# Patient Record
Sex: Female | Born: 1990 | Race: Black or African American | Hispanic: No | Marital: Single | Smoking: Never smoker
Health system: Southern US, Community
[De-identification: ages and names within clinical notes are randomized; demographics above are authoritative.]

## PROBLEM LIST (undated history)

## (undated) ENCOUNTER — Inpatient Hospital Stay (HOSPITAL_COMMUNITY): Payer: Self-pay

## (undated) DIAGNOSIS — D509 Iron deficiency anemia, unspecified: Secondary | ICD-10-CM

## (undated) DIAGNOSIS — N390 Urinary tract infection, site not specified: Secondary | ICD-10-CM

## (undated) DIAGNOSIS — F329 Major depressive disorder, single episode, unspecified: Secondary | ICD-10-CM

## (undated) DIAGNOSIS — O139 Gestational [pregnancy-induced] hypertension without significant proteinuria, unspecified trimester: Secondary | ICD-10-CM

## (undated) DIAGNOSIS — R112 Nausea with vomiting, unspecified: Secondary | ICD-10-CM

## (undated) DIAGNOSIS — B999 Unspecified infectious disease: Secondary | ICD-10-CM

## (undated) HISTORY — PX: GANGLION CYST EXCISION: SHX1691

## (undated) HISTORY — PX: TONSILLECTOMY: SUR1361

---
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
## 2011-01-21 ENCOUNTER — Emergency Department (HOSPITAL_COMMUNITY)
Admission: EM | Admit: 2011-01-21 | Discharge: 2011-01-21 | Disposition: A | Discharge status: Home / Self Care | Payer: Self-pay | Attending: Emergency Medicine | Admitting: Emergency Medicine

## 2011-01-21 DIAGNOSIS — N12 Tubulo-interstitial nephritis, not specified as acute or chronic: Secondary | ICD-10-CM | POA: Insufficient documentation

## 2011-01-21 DIAGNOSIS — R109 Unspecified abdominal pain: Secondary | ICD-10-CM | POA: Insufficient documentation

## 2011-01-21 DIAGNOSIS — R300 Dysuria: Secondary | ICD-10-CM | POA: Insufficient documentation

## 2011-01-21 LAB — URINALYSIS, ROUTINE W REFLEX MICROSCOPIC
Glucose, UA: NEGATIVE mg/dL
Specific Gravity, Urine: 1.01 (ref 1.005–1.030)
pH: 8.5 — ABNORMAL HIGH (ref 5.0–8.0)

## 2011-01-21 LAB — URINE MICROSCOPIC-ADD ON

## 2011-01-23 LAB — URINE CULTURE: Colony Count: >=100000

## 2012-03-09 ENCOUNTER — Encounter (HOSPITAL_COMMUNITY): Payer: Self-pay | Admitting: *Deleted

## 2012-03-09 DIAGNOSIS — B373 Candidiasis of vulva and vagina: Secondary | ICD-10-CM | POA: Insufficient documentation

## 2012-03-09 DIAGNOSIS — B9689 Other specified bacterial agents as the cause of diseases classified elsewhere: Secondary | ICD-10-CM | POA: Insufficient documentation

## 2012-03-09 DIAGNOSIS — A499 Bacterial infection, unspecified: Secondary | ICD-10-CM | POA: Insufficient documentation

## 2012-03-09 DIAGNOSIS — B3731 Acute candidiasis of vulva and vagina: Secondary | ICD-10-CM | POA: Insufficient documentation

## 2012-03-09 DIAGNOSIS — O2390 Unspecified genitourinary tract infection in pregnancy, unspecified trimester: Secondary | ICD-10-CM | POA: Insufficient documentation

## 2012-03-09 DIAGNOSIS — N760 Acute vaginitis: Secondary | ICD-10-CM | POA: Insufficient documentation

## 2012-03-09 NOTE — ED Notes (Signed)
Abdominal pain and back pain x 2 months.  Worried about trichamonis, wants to be checked for STD.  Green discharge for 1 week.  Denies n/v, SOB, CP.  Pt states "think I am 4 months pregnant"

## 2012-03-10 ENCOUNTER — Emergency Department (HOSPITAL_COMMUNITY): Payer: Medicaid Other

## 2012-03-10 ENCOUNTER — Emergency Department (HOSPITAL_COMMUNITY)
Admission: EM | Admit: 2012-03-10 | Discharge: 2012-03-10 | Disposition: A | Discharge status: Home / Self Care | Payer: Medicaid Other | Attending: Emergency Medicine | Admitting: Emergency Medicine

## 2012-03-10 DIAGNOSIS — Z3490 Encounter for supervision of normal pregnancy, unspecified, unspecified trimester: Secondary | ICD-10-CM

## 2012-03-10 DIAGNOSIS — B9689 Other specified bacterial agents as the cause of diseases classified elsewhere: Secondary | ICD-10-CM

## 2012-03-10 DIAGNOSIS — B373 Candidiasis of vulva and vagina: Secondary | ICD-10-CM

## 2012-03-10 HISTORY — DX: Urinary tract infection, site not specified: N39.0

## 2012-03-10 LAB — COMPREHENSIVE METABOLIC PANEL
BUN: 6 mg/dL (ref 6–23)
Calcium: 9 mg/dL (ref 8.4–10.5)
Creatinine, Ser: 0.55 mg/dL (ref 0.50–1.10)
GFR calc Af Amer: >90 mL/min (ref >90)
Glucose, Bld: 92 mg/dL (ref 70–99)
Sodium: 137 mEq/L (ref 135–145)
Total Protein: 6 g/dL (ref 6.0–8.3)

## 2012-03-10 LAB — CBC WITH DIFFERENTIAL/PLATELET
Eosinophils Absolute: 0.1 10*3/uL (ref 0.0–0.7)
Eosinophils Relative: 1 % (ref 0–5)
Lymphs Abs: 2.5 10*3/uL (ref 0.7–4.0)
MCH: 27.9 pg (ref 26.0–34.0)
MCV: 82.3 fL (ref 78.0–100.0)
Monocytes Relative: 11 % (ref 3–12)
Platelets: 275 10*3/uL (ref 150–400)
RBC: 3.55 MIL/uL — ABNORMAL LOW (ref 3.87–5.11)

## 2012-03-10 LAB — URINALYSIS, ROUTINE W REFLEX MICROSCOPIC
Ketones, ur: NEGATIVE mg/dL
Nitrite: NEGATIVE
Specific Gravity, Urine: 1.02 (ref 1.005–1.030)
pH: 8 (ref 5.0–8.0)

## 2012-03-10 LAB — WET PREP, GENITAL

## 2012-03-10 LAB — URINE MICROSCOPIC-ADD ON

## 2012-03-10 MED ORDER — METRONIDAZOLE 500 MG PO TABS: 500.0000 mg | ORAL_TABLET | Freq: Two times a day (BID) | ORAL | Status: AC

## 2012-03-10 MED ORDER — MICONAZOLE NITRATE 100-2 MG-% VA KIT: 1.0000 | PACK | Freq: Two times a day (BID) | VAGINAL | Status: DC

## 2012-03-10 NOTE — ED Provider Notes (Signed)
History     CSN: [id number]  Arrival date & time 03/09/12  2302   First MD Initiated Contact with Patient 03/10/12 [phone]      Chief Complaint  Patient presents with  . Abdominal Pain    (Consider location/radiation/quality/duration/timing/severity/associated sxs/prior treatment) HPI  Pt to the ER with complaints of back pain and abdominal pain for 2 months. She says that she has taken a pregnancy test 3 months ago and it said that she was pregnant. Her last period was between 4-5 months ago. The patient has not sought out any prenatal care. She just "didnt think to do anything". She did notice that her belly was growing because "it is normally flat". She says that her sister shared her vaginal cleansing systems and her sister has trichomoniasis. She denies having N/V/D fevers, weakness, headaches or any other symptoms.  Past Medical History  Diagnosis Date  . UTI (lower urinary tract infection)     Past Surgical History  Procedure Date  . Ganglion cyst excision     History reviewed. No pertinent family history.  History  Substance Use Topics  . Smoking status: Never Smoker   . Smokeless tobacco: Not on file  . Alcohol Use: No    OB History    Grav Para Term Preterm Abortions TAB SAB Ect Mult Living                  Review of Systems  HEENT: denies blurry vision or change in hearing PULMONARY: Denies difficulty breathing and SOB CARDIAC: denies chest pain or heart palpitations MUSCULOSKELETAL:  denies being unable to ambulate ABDOMEN AL: denies severe abdominal pain GU: denies loss of bowel or urinary control NEURO: denies numbness and tingling in extremities SKIN: no new rashes PSYCH: patient denies anxiety or depression. NECK: Pt denies having neck pain    Allergies  Review of patient's allergies indicates no known allergies.  Home Medications  No current outpatient prescriptions on file.  BP 94/60  Pulse 81  Temp 98.4 F (36.9 C) (Oral)  Resp 18   SpO2 100%  LMP 11/27/2011  Physical Exam  Nursing note and vitals reviewed. Constitutional: She appears well-developed and well-nourished. No distress.  HENT:  Head: Normocephalic and atraumatic.  Eyes: Pupils are equal, round, and reactive to light.  Neck: Normal range of motion. Neck supple.  Cardiovascular: Normal rate and regular rhythm.   Pulmonary/Chest: Effort normal.  Abdominal: Soft. There is no hepatosplenomegaly. There is no rigidity, no rebound, no guarding and no CVA tenderness.  Genitourinary: Uterus is enlarged (gravid). Cervix exhibits discharge. Cervix exhibits no motion tenderness. There is tenderness around the vagina. No erythema or bleeding around the vagina. No foreign body around the vagina. There are signs of injury around the vagina. Vaginal discharge found.  Neurological: She is alert.  Skin: Skin is warm and dry.    ED Course  Procedures (including critical care time)  Labs Reviewed  URINALYSIS, ROUTINE W REFLEX MICROSCOPIC - Abnormal; Notable for the following:    APPearance TURBID (*)     Leukocytes, UA SMALL (*)     All other components within normal limits  POCT PREGNANCY, URINE - Abnormal; Notable for the following:    Preg Test, Ur POSITIVE (*)     All other components within normal limits  URINE MICROSCOPIC-ADD ON - Abnormal; Notable for the following:    Bacteria, UA MANY (*)     All other components within normal limits  HCG, QUANTITATIVE, PREGNANCY - Abnormal;  Notable for the following:    hCG, Beta Chain, Quant, S [id number] (*)     All other components within normal limits  CBC WITH DIFFERENTIAL - Abnormal; Notable for the following:    RBC 3.55 (*)     Hemoglobin 9.9 (*)     HCT 29.2 (*)     All other components within normal limits  COMPREHENSIVE METABOLIC PANEL - Abnormal; Notable for the following:    Albumin 2.5 (*)     Total Bilirubin 0.1 (*)     All other components within normal limits  GC/CHLAMYDIA PROBE AMP, URINE  WET PREP,  GENITAL  GC/CHLAMYDIA PROBE AMP, GENITAL   US Ob Limited  03/10/2012  *RADIOLOGY REPORT*  Clinical Data: Pregnant, abdominal cramping.  LIMITED OBSTETRIC ULTRASOUND  Number of Fetuses: 1 Heart Rate: 137 bpm Movement: Identified Presentation: Breech Placental Location: Anterior Previa: Not identified Amniotic Fluid (Subjective): Within normal limits  Vertical pocket:  02/28cm  BPD: 4.0cm   18w   1d   EDC: 08/10/2012  MATERNAL FINDINGS: Uterus/Adnexae: Ovaries not identified.  IMPRESSION: Single intrauterine gestation with cardiac activity and movement documented. Estimated age of 18 weeks 1 day by BPD.  Recommend followup with non-emergent complete OB 14+ wk US examination for fetal biometric evaluation and anatomic survey if not already performed.   Original Report Authenticated By: Waneta Martins, M.D.      1. Pregnant       MDM  Labs show that patient is anemic, she states that she is aware that her blood counts are low. She is pregnant about 18 weeks and ultrasound confirms IUP with fetal heart movement. Will start patient on prenatals and give her referral for follow-up appointment.     Pt cultures pending. Pt handed off to oncoming PA.      Dorthula Matas, PA 03/10/12 [phone]

## 2012-03-10 NOTE — ED Provider Notes (Signed)
21 y/o pregnant [facility] passed from [employer], [place]. Wet prep positive for yeast and clue cells. Diflucan not recommended in pregnancy. Treatment with monistat vaginally. BV treatment with flagyl. Prenatal care discussed by Marlon Pel. Follow up with [facility].  Trevor Mace, [place] 03/10/12 [phone]

## 2012-03-10 NOTE — ED Notes (Signed)
Pt c/o lower diffuse back pain that radiates to lateral abd and then around to lower abd  x 2 months. Pt reports a few months ago she had an increase in vaginal discharge with a yellow tint. Last week the pt noticed her discharge has changed green. Pt reports her sister was d/x with trichomoniasis, pt realized she had been sharing the same feminine hygiene products and bath towels with her sisters.

## 2012-03-14 NOTE — ED Provider Notes (Signed)
Medical screening examination/treatment/procedure(s) were conducted as a shared visit with non-physician practitioner(s) and myself.  I personally evaluated the patient during the encounter.  No acute abdomen.   Ultrasound shows 18-1/7 week intrauterine gestation  Donnetta Hutching, MD 03/14/12 [phone]

## 2012-03-15 NOTE — ED Notes (Signed)
+  Chlamydia Chart sent to EDP office for review.  

## 2012-03-15 NOTE — ED Provider Notes (Signed)
Medical screening examination/treatment/procedure(s) were performed by non-physician practitioner and as supervising physician I was immediately available for consultation/collaboration.  Donnetta Hutching, MD 03/15/12 1530

## 2012-03-17 NOTE — ED Notes (Signed)
Left message for patient to call back  

## 2012-03-18 NOTE — ED Notes (Signed)
Left voicemail for patient to call back. 

## 2012-03-27 NOTE — ED Notes (Signed)
Current scriber attempted to contact patient to check on status of treatment. Message left with mother for patient to return call.According to [facility] at [facility]  Patient has not been treated or currently scheduled for an appointment with them.

## 2012-03-27 NOTE — ED Notes (Signed)
ON 03/20/2012 Patty PFM talked with  Patient and she stated that," she did not believe the results and wanted to be retested".She was referred to the STD clinic. Prescription not called in.

## 2012-04-01 ENCOUNTER — Telehealth (HOSPITAL_COMMUNITY): Payer: Self-pay | Admitting: Emergency Medicine

## 2012-07-02 ENCOUNTER — Encounter (HOSPITAL_COMMUNITY): Payer: Self-pay | Admitting: *Deleted

## 2012-07-02 ENCOUNTER — Inpatient Hospital Stay (HOSPITAL_COMMUNITY)
Admission: AD | Admit: 2012-07-02 | Discharge: 2012-07-07 | DRG: 765 | Disposition: A | Discharge status: Home / Self Care | Payer: Medicaid Other | Source: Ambulatory Visit | Attending: Obstetrics | Admitting: Obstetrics

## 2012-07-02 DIAGNOSIS — O321XX0 Maternal care for breech presentation, not applicable or unspecified: Secondary | ICD-10-CM | POA: Diagnosis present

## 2012-07-02 DIAGNOSIS — O365990 Maternal care for other known or suspected poor fetal growth, unspecified trimester, not applicable or unspecified: Secondary | ICD-10-CM | POA: Diagnosis present

## 2012-07-02 DIAGNOSIS — O0930 Supervision of pregnancy with insufficient antenatal care, unspecified trimester: Secondary | ICD-10-CM

## 2012-07-02 DIAGNOSIS — O9903 Anemia complicating the puerperium: Secondary | ICD-10-CM | POA: Diagnosis not present

## 2012-07-02 DIAGNOSIS — O169 Unspecified maternal hypertension, unspecified trimester: Secondary | ICD-10-CM | POA: Diagnosis present

## 2012-07-02 DIAGNOSIS — D649 Anemia, unspecified: Secondary | ICD-10-CM | POA: Diagnosis not present

## 2012-07-02 DIAGNOSIS — O139 Gestational [pregnancy-induced] hypertension without significant proteinuria, unspecified trimester: Principal | ICD-10-CM | POA: Diagnosis present

## 2012-07-02 DIAGNOSIS — O4100X0 Oligohydramnios, unspecified trimester, not applicable or unspecified: Secondary | ICD-10-CM | POA: Diagnosis present

## 2012-07-02 HISTORY — DX: Gestational (pregnancy-induced) hypertension without significant proteinuria, unspecified trimester: O13.9

## 2012-07-02 HISTORY — DX: Unspecified infectious disease: B99.9

## 2012-07-02 LAB — CBC
MCH: 20.4 pg — ABNORMAL LOW (ref 26.0–34.0)
MCHC: 30.2 g/dL (ref 30.0–36.0)
MCV: 67.5 fL — ABNORMAL LOW (ref 78.0–100.0)
Platelets: 243 10*3/uL (ref 150–400)
RDW: 17.8 % — ABNORMAL HIGH (ref 11.5–15.5)
WBC: 9.8 10*3/uL (ref 4.0–10.5)

## 2012-07-02 LAB — DIFFERENTIAL
Basophils Absolute: 0 10*3/uL (ref 0.0–0.1)
Basophils Relative: 0 % (ref 0–1)
Eosinophils Absolute: 0.1 10*3/uL (ref 0.0–0.7)
Eosinophils Relative: 1 % (ref 0–5)
Lymphocytes Relative: 17 % (ref 12–46)

## 2012-07-02 LAB — OB RESULTS CONSOLE ABO/RH: RH Type: NEGATIVE

## 2012-07-02 LAB — COMPREHENSIVE METABOLIC PANEL
AST: 22 U/L (ref 0–37)
Albumin: 2.1 g/dL — ABNORMAL LOW (ref 3.5–5.2)
Calcium: 8.7 mg/dL (ref 8.4–10.5)
Creatinine, Ser: 0.67 mg/dL (ref 0.50–1.10)
Total Protein: 5.8 g/dL — ABNORMAL LOW (ref 6.0–8.3)

## 2012-07-02 LAB — OB RESULTS CONSOLE HIV ANTIBODY (ROUTINE TESTING): HIV: NONREACTIVE

## 2012-07-02 LAB — HEPATITIS B SURFACE ANTIGEN: Hepatitis B Surface Ag: NEGATIVE

## 2012-07-02 LAB — PROCEDURE REPORT - SCANNED: Pap: NEGATIVE

## 2012-07-02 LAB — PREPARE RBC (CROSSMATCH)

## 2012-07-02 LAB — RAPID HIV SCREEN (WH-MAU): Rapid HIV Screen: NONREACTIVE

## 2012-07-02 MED ORDER — MAGNESIUM SULFATE 40 G IN LACTATED RINGERS - SIMPLE
2.0000 g/h | INTRAVENOUS | Status: DC
  Administered 2012-07-02 – 2012-07-03 (×2): 2 g/h via INTRAVENOUS
  Filled 2012-07-02 (×2): qty 500

## 2012-07-02 MED ORDER — CALCIUM CARBONATE ANTACID 500 MG PO CHEW: 2.0000 | CHEWABLE_TABLET | ORAL | Status: DC | PRN

## 2012-07-02 MED ORDER — ZOLPIDEM TARTRATE 5 MG PO TABS: 5.0000 mg | ORAL_TABLET | Freq: Every evening | ORAL | Status: DC | PRN

## 2012-07-02 MED ORDER — DOCUSATE SODIUM 100 MG PO CAPS
100.0000 mg | ORAL_CAPSULE | Freq: Every day | ORAL | Status: DC
  Administered 2012-07-02 – 2012-07-03 (×2): 100 mg via ORAL
  Filled 2012-07-02 (×2): qty 1

## 2012-07-02 MED ORDER — ACETAMINOPHEN 325 MG PO TABS: 650.0000 mg | ORAL_TABLET | ORAL | Status: DC | PRN

## 2012-07-02 MED ORDER — MAGNESIUM SULFATE BOLUS VIA INFUSION
4.0000 g | Freq: Once | INTRAVENOUS | Status: DC
  Filled 2012-07-02: qty 500

## 2012-07-02 MED ORDER — PRENATAL MULTIVITAMIN CH
1.0000 | ORAL_TABLET | Freq: Every day | ORAL | Status: DC
  Administered 2012-07-02 – 2012-07-03 (×2): 1 via ORAL
  Filled 2012-07-02 (×2): qty 1

## 2012-07-02 MED ORDER — LACTATED RINGERS IV SOLN
INTRAVENOUS | Status: DC
  Administered 2012-07-03 (×2): via INTRAVENOUS

## 2012-07-03 ENCOUNTER — Encounter (HOSPITAL_COMMUNITY): Payer: Medicaid Other

## 2012-07-03 ENCOUNTER — Inpatient Hospital Stay (HOSPITAL_COMMUNITY): Payer: Medicaid Other

## 2012-07-03 ENCOUNTER — Encounter (HOSPITAL_COMMUNITY): Payer: Self-pay | Admitting: Obstetrics

## 2012-07-03 DIAGNOSIS — O169 Unspecified maternal hypertension, unspecified trimester: Secondary | ICD-10-CM | POA: Diagnosis present

## 2012-07-03 DIAGNOSIS — O0930 Supervision of pregnancy with insufficient antenatal care, unspecified trimester: Secondary | ICD-10-CM

## 2012-07-03 LAB — CREATININE CLEARANCE, URINE, 24 HOUR
Collection Interval-CRCL: 24 hours
Creatinine Clearance: 170 mL/min — ABNORMAL HIGH (ref 75–115)
Urine Total Volume-CRCL: 6300 mL

## 2012-07-03 LAB — PROTEIN, URINE, 24 HOUR
Collection Interval-UPROT: 24 hours
Protein, 24H Urine: 2646 mg/d — ABNORMAL HIGH (ref 50–100)
Protein, Urine: 42 mg/dL

## 2012-07-03 MED ORDER — SODIUM CHLORIDE 0.9 % IJ SOLN
3.0000 mL | Freq: Two times a day (BID) | INTRAMUSCULAR | Status: DC
  Administered 2012-07-03: 3 mL via INTRAVENOUS

## 2012-07-03 MED ORDER — RHO D IMMUNE GLOBULIN 1500 UNIT/2ML IJ SOLN
300.0000 ug | Freq: Once | INTRAMUSCULAR | Status: AC
  Administered 2012-07-03: 300 ug via INTRAMUSCULAR
  Filled 2012-07-03: qty 2

## 2012-07-03 NOTE — Progress Notes (Signed)
MATERNAL FETAL MEDICINE CONSULT  Patient Name: Amanda Campos Medical Record Number:  [id number] Date of Birth: 06/30/1991 Requesting Physician Name:  Bernard A Marshall, MD Date of Service: 07/03/2012  Chief Complaint Elevated BPs, no prenatal care  History of Present Illness Amanda Campos was seen today for prenatal diagnosis secondary to elevated blood pressures in the setting of no prenatal care at [redacted]w[redacted]d at the request of Dr. Marshall.  The patient is a 21 y.o. G1P0000,at [redacted]w[redacted]d with an EDD of 08/10/2012, by 18 week ultrasound at [place] [facility].  Amanda Campos reports she does not have any personal history of hypertension.  She reports headaches for the last several weeks in the morning that spontaneously resolve upon eating/drinking in the morning.  She denies any visual changes and denies RUQ pain.  She endorses rare contractions, no loss of fluid, no vaginal bleeding and good gross fetal movement.  She reports being admitted upon finding an elevated blood pressure at her office visit.  She denies any shortness of breath or chest pain.    Review of Systems Pertinent items are noted in HPI.  Patient History OB History    Grav Para Term Preterm Abortions TAB SAB Ect Mult Living   1 0 0 0 0 0 0 0 0 0     # Outc Date GA Lbr Len/2nd Wgt Sex Del Anes PTL Lv   1 CUR               Past Medical History  Diagnosis Date  . UTI (lower urinary tract infection)   . Pregnancy induced hypertension   . Infection 2011    kidney infection, was treated    Past Surgical History  Procedure Date  . Ganglion cyst excision     History   Social History  . Marital Status: Single    Spouse Name: N/A    Number of Children: N/A  . Years of Education: N/A   Social History Main Topics  . Smoking status: Never Smoker   . Smokeless tobacco: Never Used  . Alcohol Use: No  . Drug Use: No  . Sexually Active: Yes   Other Topics Concern  . None   Social History Narrative  . None    History  reviewed. No pertinent family history. In addition, the patient has no family history of mental retardation, birth defects, or genetic diseases.  Physical Examination Filed Vitals:   07/03/12 1100  BP: 143/100  Pulse: 92  Temp: 98.6  Resp: 18   General appearance - alert, well appearing, and in no distress Chest- clear to auscultation bilaterally CV- RRR Abd- soft, gravid, Nontender Ext- 3+ DTRs bilateral lower extremities, no clonus  Assessment and Recommendations 21 year old G1 @ [redacted]w[redacted]d with elevated blood pressures in the setting of no prenatal care - At this time Amanda Campos meets criteria for gestational hypertension without severe features.  She had one blood pressure in the severe range but the remainder of her pressures were mild range.  She does not endorse any symptoms of preeclampsia at this time.  Please see [facility] for full report but ultrasound shows asymmetric fetal growth that is overall in the 19th percentile but has an AC <5th percentile.  Oligohydramnios is present by AFI criteria (3.3) but a 2 x 2 cm pocket is visibile.  - At this point we recommend continuing the evaluation for preeclampsia with expectant management until 37 weeks at which time a delivery in the setting of either gestational hypertension   or preeclampsia would be warranted.  We would like to reevaluate her fetus and oligohydramnios with a BPP on Friday. -Amanda Campos is currently breech and would require a cesarean should delivery be imminent.   -This patient was discussed with Dr. Decker who agrees with the above plan.   Thank you for allowing me to participate in the care of this patient.   Naydeen Speirs, MD Maternal Fetal Medicine Fellow  

## 2012-07-03 NOTE — H&P (Signed)
This is Dr. Francoise Ceo dictating the history and physical on  Amanda Campos she's a 21 year old primigravida EDC  1 24  14  0 at 34 weeks and 4 days   blood type O- who has had no prenatal care she was seen in the office on 1216 with a blood pressure level  155/105   Two  plus edema no headache no epigastric pain patient says she's been going to [facility] whenever she feels like    and she was admitted because of PIH on admission hemoglobin 7.8 platelets 243 glucose of  72 AST 22 ALT 12 and she was started on magnesium sulfate 4 g loading rhythm no her blood pressure no is 151/88 and she is to see MFM today and you and and ultrasound Past medical history negative Past surgical history negative System review negative Social history noncontributory HEENT negative breasts engorged Lungs clear to P&A Heart regular rhythm no murmurs no gallops Abdomen Foley size uterus with a normal fetal heart note Pelvic cervix long closed GBS unknown cultures were done Extremities 2+ edema

## 2012-07-03 NOTE — Consult Note (Signed)
MATERNAL FETAL MEDICINE CONSULT  Patient Name: Amanda Campos Medical Record Number:  [id number] Date of Birth: 03/08/1991 Requesting Physician Name:  Kathreen Cosier, MD Date of Service: 07/03/2012  Chief Complaint Elevated BPs, no prenatal care  History of Present Illness Amanda Campos was seen today for prenatal diagnosis secondary to elevated blood pressures in the setting of no prenatal care at [redacted]w[redacted]d at the request of Dr. Gaynell Face.  The patient is a 21 y.o. G1P0000,at [redacted]w[redacted]d with an EDD of 08/10/2012, by 18 week ultrasound at [facility].  Amanda Campos reports she does not have any personal history of hypertension.  She reports headaches for the last several weeks in the morning that spontaneously resolve upon eating/drinking in the morning.  She denies any visual changes and denies RUQ pain.  She endorses rare contractions, no loss of fluid, no vaginal bleeding and good gross fetal movement.  She reports being admitted upon finding an elevated blood pressure at her office visit.  She denies any shortness of breath or chest pain.    Review of Systems Pertinent items are noted in HPI.  Patient History OB History    Grav Para Term Preterm Abortions TAB SAB Ect Mult Living   1 0 0 0 0 0 0 0 0 0      # Outc Date GA Lbr Len/2nd Wgt Sex Del Anes PTL Lv   1 CUR               Past Medical History  Diagnosis Date  . UTI (lower urinary tract infection)   . Pregnancy induced hypertension   . Infection 2011    kidney infection, was treated    Past Surgical History  Procedure Date  . Ganglion cyst excision     History   Social History  . Marital Status: Single    Spouse Name: N/A    Number of Children: N/A  . Years of Education: N/A   Social History Main Topics  . Smoking status: Never Smoker   . Smokeless tobacco: Never Used  . Alcohol Use: No  . Drug Use: No  . Sexually Active: Yes   Other Topics Concern  . None   Social History Narrative  . None    History  reviewed. No pertinent family history. In addition, the patient has no family history of mental retardation, birth defects, or genetic diseases.  Physical Examination Filed Vitals:   07/03/12 1100  BP: 143/100  Pulse: 92  Temp: 98.6  Resp: 18   General appearance - alert, well appearing, and in no distress Chest- clear to auscultation bilaterally CV- RRR Abd- soft, gravid, Nontender Ext- 3+ DTRs bilateral lower extremities, no clonus  Assessment and Recommendations 21 year old G1 @ [redacted]w[redacted]d with elevated blood pressures in the setting of no prenatal care - At this time Amanda Campos meets criteria for gestational hypertension without severe features.  She had one blood pressure in the severe range but the remainder of her pressures were mild range.  She does not endorse any symptoms of preeclampsia at this time.  Please see [facility] for full report but ultrasound shows asymmetric fetal growth that is overall in the 19th percentile but has an AC <5th percentile.  Oligohydramnios is present by AFI criteria (3.3) but a 2 x 2 cm pocket is visibile.  - At this point we recommend continuing the evaluation for preeclampsia with expectant management until 37 weeks at which time a delivery in the setting of either gestational hypertension  or preeclampsia would be warranted.  We would like to reevaluate her fetus and oligohydramnios with a BPP on Friday. -Amanda Campos is currently breech and would require a cesarean should delivery be imminent.   -This patient was discussed with Dr. Sherrie George who agrees with the above plan.   Thank you for allowing me to participate in the care of this patient.   Molly Maduro, MD Maternal Fetal Medicine Fellow

## 2012-07-04 ENCOUNTER — Inpatient Hospital Stay (HOSPITAL_COMMUNITY): Payer: Medicaid Other | Admitting: Anesthesiology

## 2012-07-04 ENCOUNTER — Encounter (HOSPITAL_COMMUNITY): Payer: Self-pay | Admitting: Anesthesiology

## 2012-07-04 ENCOUNTER — Encounter (HOSPITAL_COMMUNITY)
Admission: AD | Disposition: A | Discharge status: Home / Self Care | Payer: Self-pay | Source: Ambulatory Visit | Attending: Obstetrics

## 2012-07-04 ENCOUNTER — Encounter (HOSPITAL_COMMUNITY): Payer: Self-pay | Admitting: *Deleted

## 2012-07-04 DIAGNOSIS — O09299 Supervision of pregnancy with other poor reproductive or obstetric history, unspecified trimester: Secondary | ICD-10-CM

## 2012-07-04 LAB — RH IG WORKUP (INCLUDES ABO/RH)
Fetal Screen: NEGATIVE
Gestational Age(Wks): 34.4

## 2012-07-04 LAB — CBC
HCT: 26.6 % — ABNORMAL LOW (ref 36.0–46.0)
Hemoglobin: 8.1 g/dL — ABNORMAL LOW (ref 12.0–15.0)
MCH: 20.4 pg — ABNORMAL LOW (ref 26.0–34.0)
MCHC: 30.5 g/dL (ref 30.0–36.0)
RBC: 3.97 MIL/uL (ref 3.87–5.11)

## 2012-07-04 SURGERY — Surgical Case
Anesthesia: Spinal | Site: Abdomen | Wound class: Clean Contaminated

## 2012-07-04 MED ORDER — ZOLPIDEM TARTRATE 5 MG PO TABS: 5.0000 mg | ORAL_TABLET | Freq: Every evening | ORAL | Status: DC | PRN

## 2012-07-04 MED ORDER — OXYCODONE-ACETAMINOPHEN 5-325 MG PO TABS
1.0000 | ORAL_TABLET | ORAL | Status: DC | PRN
  Administered 2012-07-05 – 2012-07-07 (×3): 1 via ORAL
  Filled 2012-07-04 (×3): qty 1

## 2012-07-04 MED ORDER — SENNOSIDES-DOCUSATE SODIUM 8.6-50 MG PO TABS
2.0000 | ORAL_TABLET | Freq: Every day | ORAL | Status: DC
  Administered 2012-07-05 – 2012-07-06 (×2): 2 via ORAL

## 2012-07-04 MED ORDER — WITCH HAZEL-GLYCERIN EX PADS: 1.0000 "application " | MEDICATED_PAD | CUTANEOUS | Status: DC | PRN

## 2012-07-04 MED ORDER — MEPERIDINE HCL 25 MG/ML IJ SOLN: 6.2500 mg | INTRAMUSCULAR | Status: DC | PRN

## 2012-07-04 MED ORDER — SIMETHICONE 80 MG PO CHEW: 80.0000 mg | CHEWABLE_TABLET | ORAL | Status: DC | PRN

## 2012-07-04 MED ORDER — OXYTOCIN 10 UNIT/ML IJ SOLN
40.0000 [IU] | INTRAVENOUS | Status: DC | PRN
  Administered 2012-07-04: 40 [IU] via INTRAVENOUS

## 2012-07-04 MED ORDER — ONDANSETRON HCL 4 MG/2ML IJ SOLN: 4.0000 mg | INTRAMUSCULAR | Status: DC | PRN

## 2012-07-04 MED ORDER — HYDROMORPHONE HCL PF 1 MG/ML IJ SOLN: 0.2500 mg | INTRAMUSCULAR | Status: DC | PRN

## 2012-07-04 MED ORDER — NALBUPHINE SYRINGE 5 MG/0.5 ML
5.0000 mg | INJECTION | INTRAMUSCULAR | Status: DC | PRN
  Filled 2012-07-04: qty 1

## 2012-07-04 MED ORDER — EPHEDRINE SULFATE 50 MG/ML IJ SOLN
INTRAMUSCULAR | Status: DC | PRN
  Administered 2012-07-04 (×4): 5 mg via INTRAVENOUS

## 2012-07-04 MED ORDER — CITRIC ACID-SODIUM CITRATE 334-500 MG/5ML PO SOLN
ORAL | Status: AC
  Administered 2012-07-04: 30 mL
  Filled 2012-07-04: qty 15

## 2012-07-04 MED ORDER — ONDANSETRON HCL 4 MG/2ML IJ SOLN
INTRAMUSCULAR | Status: AC
  Filled 2012-07-04: qty 2

## 2012-07-04 MED ORDER — DIPHENHYDRAMINE HCL 50 MG/ML IJ SOLN: 25.0000 mg | INTRAMUSCULAR | Status: DC | PRN

## 2012-07-04 MED ORDER — DIPHENHYDRAMINE HCL 25 MG PO CAPS
25.0000 mg | ORAL_CAPSULE | Freq: Four times a day (QID) | ORAL | Status: DC | PRN
Start: 2012-07-04 — End: 2012-07-07

## 2012-07-04 MED ORDER — NALOXONE HCL 0.4 MG/ML IJ SOLN: 0.4000 mg | INTRAMUSCULAR | Status: DC | PRN

## 2012-07-04 MED ORDER — METOCLOPRAMIDE HCL 5 MG/ML IJ SOLN: 10.0000 mg | Freq: Three times a day (TID) | INTRAMUSCULAR | Status: DC | PRN

## 2012-07-04 MED ORDER — KETOROLAC TROMETHAMINE 30 MG/ML IJ SOLN: 30.0000 mg | Freq: Four times a day (QID) | INTRAMUSCULAR | Status: AC | PRN

## 2012-07-04 MED ORDER — DIBUCAINE 1 % RE OINT: 1.0000 "application " | TOPICAL_OINTMENT | RECTAL | Status: DC | PRN

## 2012-07-04 MED ORDER — MORPHINE SULFATE 0.5 MG/ML IJ SOLN
INTRAMUSCULAR | Status: AC
  Filled 2012-07-04: qty 10

## 2012-07-04 MED ORDER — NALOXONE HCL 1 MG/ML IJ SOLN
1.0000 ug/kg/h | INTRAVENOUS | Status: DC | PRN
  Filled 2012-07-04: qty 2

## 2012-07-04 MED ORDER — LANOLIN HYDROUS EX OINT: 1.0000 "application " | TOPICAL_OINTMENT | CUTANEOUS | Status: DC | PRN

## 2012-07-04 MED ORDER — OXYTOCIN 10 UNIT/ML IJ SOLN
INTRAMUSCULAR | Status: AC
  Filled 2012-07-04: qty 4

## 2012-07-04 MED ORDER — CEFAZOLIN SODIUM-DEXTROSE 2-3 GM-% IV SOLR
2.0000 g | Freq: Once | INTRAVENOUS | Status: AC
  Administered 2012-07-04 (×2): 2 g via INTRAVENOUS
  Filled 2012-07-04: qty 50

## 2012-07-04 MED ORDER — MEPERIDINE HCL 25 MG/ML IJ SOLN
INTRAMUSCULAR | Status: AC
  Filled 2012-07-04: qty 1

## 2012-07-04 MED ORDER — DIPHENHYDRAMINE HCL 50 MG/ML IJ SOLN: 12.5000 mg | INTRAMUSCULAR | Status: DC | PRN

## 2012-07-04 MED ORDER — PROMETHAZINE HCL 25 MG/ML IJ SOLN: 6.2500 mg | INTRAMUSCULAR | Status: DC | PRN

## 2012-07-04 MED ORDER — MORPHINE SULFATE (PF) 0.5 MG/ML IJ SOLN
INTRAMUSCULAR | Status: DC | PRN
  Administered 2012-07-04: .2 mg via INTRATHECAL

## 2012-07-04 MED ORDER — IBUPROFEN 600 MG PO TABS
600.0000 mg | ORAL_TABLET | Freq: Four times a day (QID) | ORAL | Status: DC
  Administered 2012-07-04 – 2012-07-07 (×10): 600 mg via ORAL
  Filled 2012-07-04 (×10): qty 1

## 2012-07-04 MED ORDER — OXYTOCIN 40 UNITS IN LACTATED RINGERS INFUSION - SIMPLE MED: 62.5000 mL/h | INTRAVENOUS | Status: AC

## 2012-07-04 MED ORDER — MAGNESIUM SULFATE BOLUS VIA INFUSION
4.0000 g | Freq: Once | INTRAVENOUS | Status: AC
  Administered 2012-07-04: 4 g via INTRAVENOUS
  Filled 2012-07-04: qty 500

## 2012-07-04 MED ORDER — SCOPOLAMINE 1 MG/3DAYS TD PT72
1.0000 | MEDICATED_PATCH | Freq: Once | TRANSDERMAL | Status: AC
  Administered 2012-07-04: 1.5 mg via TRANSDERMAL

## 2012-07-04 MED ORDER — FENTANYL CITRATE 0.05 MG/ML IJ SOLN
INTRAMUSCULAR | Status: AC
  Filled 2012-07-04: qty 2

## 2012-07-04 MED ORDER — LACTATED RINGERS IV SOLN
INTRAVENOUS | Status: DC | PRN
  Administered 2012-07-04 (×2): via INTRAVENOUS

## 2012-07-04 MED ORDER — FENTANYL CITRATE 0.05 MG/ML IJ SOLN
INTRAMUSCULAR | Status: DC | PRN
  Administered 2012-07-04: 12.5 ug via INTRATHECAL

## 2012-07-04 MED ORDER — MAGNESIUM SULFATE 40 G IN LACTATED RINGERS - SIMPLE
2.0000 g/h | INTRAVENOUS | Status: DC
  Administered 2012-07-04 – 2012-07-05 (×3): 2 g/h via INTRAVENOUS
  Filled 2012-07-04 (×2): qty 500

## 2012-07-04 MED ORDER — BUPIVACAINE IN DEXTROSE 0.75-8.25 % IT SOLN
INTRATHECAL | Status: DC | PRN
  Administered 2012-07-04: 1.6 mg via INTRATHECAL

## 2012-07-04 MED ORDER — SODIUM CHLORIDE 0.9 % IJ SOLN: 3.0000 mL | INTRAMUSCULAR | Status: DC | PRN

## 2012-07-04 MED ORDER — PHENYLEPHRINE HCL 10 MG/ML IJ SOLN
INTRAMUSCULAR | Status: DC | PRN
  Administered 2012-07-04: 80 ug via INTRAVENOUS
  Administered 2012-07-04: 40 ug via INTRAVENOUS
  Administered 2012-07-04 (×3): 80 ug via INTRAVENOUS
  Administered 2012-07-04: 120 ug via INTRAVENOUS

## 2012-07-04 MED ORDER — ONDANSETRON HCL 4 MG/2ML IJ SOLN: 4.0000 mg | Freq: Three times a day (TID) | INTRAMUSCULAR | Status: DC | PRN

## 2012-07-04 MED ORDER — MEPERIDINE HCL 25 MG/ML IJ SOLN
INTRAMUSCULAR | Status: DC | PRN
  Administered 2012-07-04 (×2): 12.5 mg via INTRAVENOUS

## 2012-07-04 MED ORDER — SIMETHICONE 80 MG PO CHEW
80.0000 mg | CHEWABLE_TABLET | Freq: Three times a day (TID) | ORAL | Status: DC
  Administered 2012-07-04 – 2012-07-07 (×9): 80 mg via ORAL

## 2012-07-04 MED ORDER — TETANUS-DIPHTH-ACELL PERTUSSIS 5-2.5-18.5 LF-MCG/0.5 IM SUSP
0.5000 mL | Freq: Once | INTRAMUSCULAR | Status: AC
  Administered 2012-07-05: 0.5 mL via INTRAMUSCULAR
  Filled 2012-07-04: qty 0.5

## 2012-07-04 MED ORDER — KETOROLAC TROMETHAMINE 30 MG/ML IJ SOLN
15.0000 mg | Freq: Once | INTRAMUSCULAR | Status: DC | PRN
Start: 2012-07-04 — End: 2012-07-04

## 2012-07-04 MED ORDER — SCOPOLAMINE 1 MG/3DAYS TD PT72
MEDICATED_PATCH | TRANSDERMAL | Status: AC
  Filled 2012-07-04: qty 1

## 2012-07-04 MED ORDER — DIPHENHYDRAMINE HCL 25 MG PO CAPS
25.0000 mg | ORAL_CAPSULE | ORAL | Status: DC | PRN
  Filled 2012-07-04: qty 1

## 2012-07-04 MED ORDER — LACTATED RINGERS IV SOLN
INTRAVENOUS | Status: DC
  Administered 2012-07-04: 100 mL/h via INTRAVENOUS
  Administered 2012-07-05 (×2): via INTRAVENOUS

## 2012-07-04 MED ORDER — KETOROLAC TROMETHAMINE 60 MG/2ML IM SOLN
INTRAMUSCULAR | Status: AC
  Filled 2012-07-04: qty 2

## 2012-07-04 MED ORDER — PRENATAL MULTIVITAMIN CH
1.0000 | ORAL_TABLET | Freq: Every day | ORAL | Status: DC
  Administered 2012-07-05 – 2012-07-07 (×3): 1 via ORAL
  Filled 2012-07-04 (×4): qty 1

## 2012-07-04 MED ORDER — MENTHOL 3 MG MT LOZG: 1.0000 | LOZENGE | OROMUCOSAL | Status: DC | PRN

## 2012-07-04 MED ORDER — KETOROLAC TROMETHAMINE 60 MG/2ML IM SOLN
60.0000 mg | Freq: Once | INTRAMUSCULAR | Status: AC | PRN
  Administered 2012-07-04: 60 mg via INTRAMUSCULAR

## 2012-07-04 MED ORDER — LACTATED RINGERS IV SOLN
INTRAVENOUS | Status: DC
  Administered 2012-07-04: 10:00:00 via INTRAVENOUS

## 2012-07-04 MED ORDER — LACTATED RINGERS IV SOLN
INTRAVENOUS | Status: DC | PRN
  Administered 2012-07-04: 12:00:00 via INTRAVENOUS

## 2012-07-04 MED ORDER — ONDANSETRON HCL 4 MG/2ML IJ SOLN
INTRAMUSCULAR | Status: DC | PRN
  Administered 2012-07-04: 4 mg via INTRAVENOUS

## 2012-07-04 MED ORDER — INFLUENZA VIRUS VACC SPLIT PF IM SUSP
0.5000 mL | INTRAMUSCULAR | Status: AC
  Administered 2012-07-05: 0.5 mL via INTRAMUSCULAR
  Filled 2012-07-04: qty 0.5

## 2012-07-04 MED ORDER — ONDANSETRON HCL 4 MG PO TABS: 4.0000 mg | ORAL_TABLET | ORAL | Status: DC | PRN

## 2012-07-04 SURGICAL SUPPLY — 31 items
CLOTH BEACON ORANGE TIMEOUT ST (SAFETY) ×2 IMPLANT
DERMABOND ADVANCED (GAUZE/BANDAGES/DRESSINGS) ×2
DERMABOND ADVANCED .7 DNX12 (GAUZE/BANDAGES/DRESSINGS) ×2 IMPLANT
DRAPE LG THREE QUARTER DISP (DRAPES) ×2 IMPLANT
DRSG OPSITE 6X11 MED (GAUZE/BANDAGES/DRESSINGS) ×2 IMPLANT
DRSG OPSITE POSTOP 4X10 (GAUZE/BANDAGES/DRESSINGS) ×2 IMPLANT
DURAPREP 26ML APPLICATOR (WOUND CARE) ×2 IMPLANT
ELECT REM PT RETURN 9FT ADLT (ELECTROSURGICAL) ×2
ELECTRODE REM PT RTRN 9FT ADLT (ELECTROSURGICAL) ×1 IMPLANT
EXTRACTOR VACUUM M CUP 4 TUBE (SUCTIONS) IMPLANT
GLOVE BIO SURGEON STRL SZ8.5 (GLOVE) ×4 IMPLANT
GOWN PREVENTION PLUS LG XLONG (DISPOSABLE) ×4 IMPLANT
GOWN PREVENTION PLUS XXLARGE (GOWN DISPOSABLE) ×2 IMPLANT
KIT ABG SYR 3ML LUER SLIP (SYRINGE) IMPLANT
NEEDLE HYPO 25X5/8 SAFETYGLIDE (NEEDLE) ×2 IMPLANT
NS IRRIG 1000ML POUR BTL (IV SOLUTION) ×2 IMPLANT
PACK C SECTION WH (CUSTOM PROCEDURE TRAY) ×2 IMPLANT
PAD OB MATERNITY 4.3X12.25 (PERSONAL CARE ITEMS) IMPLANT
SLEEVE SCD COMPRESS KNEE MED (MISCELLANEOUS) IMPLANT
SUT CHROMIC 0 CT 802H (SUTURE) ×2 IMPLANT
SUT CHROMIC 1 CTX 36 (SUTURE) ×4 IMPLANT
SUT CHROMIC 2 0 SH (SUTURE) ×2 IMPLANT
SUT GUT PLAIN 0 CT-3 TAN 27 (SUTURE) IMPLANT
SUT MON AB 4-0 PS1 27 (SUTURE) ×2 IMPLANT
SUT VIC AB 0 CT1 18XCR BRD8 (SUTURE) IMPLANT
SUT VIC AB 0 CT1 8-18 (SUTURE)
SUT VIC AB 0 CTX 36 (SUTURE) ×2
SUT VIC AB 0 CTX36XBRD ANBCTRL (SUTURE) ×2 IMPLANT
TOWEL OR 17X24 6PK STRL BLUE (TOWEL DISPOSABLE) ×6 IMPLANT
TRAY FOLEY CATH 14FR (SET/KITS/TRAYS/PACK) ×2 IMPLANT
WATER STERILE IRR 1000ML POUR (IV SOLUTION) ×2 IMPLANT

## 2012-07-04 NOTE — Anesthesia Procedure Notes (Signed)
Spinal  Patient location during procedure: OR Start time: 07/04/2012 11:15 AM End time: 07/04/2012 11:19 AM Staffing Anesthesiologist: Sandrea Hughs Performed by: anesthesiologist  Preanesthetic Checklist Completed: patient identified, site marked, surgical consent, pre-op evaluation, timeout performed, IV checked, risks and benefits discussed and monitors and equipment checked Spinal Block Patient position: sitting Prep: DuraPrep Patient monitoring: heart rate, cardiac monitor, continuous pulse ox and blood pressure Approach: midline Location: L3-4 Injection technique: single-shot Needle Needle type: Sprotte  Needle gauge: 24 G Needle length: 9 cm Needle insertion depth: 8 cm Assessment Sensory level: T4

## 2012-07-04 NOTE — Consult Note (Signed)
Neonatology Note:  Attendance at C-section:  I was asked to attend this primary C/S at 34 5/7 weeks due to PIH and breech presentation. The mother is a G1P0 O neg, GBS neg with no PNC, oligohydramnios, IUGR, and PIH. ROM at delivery, fluid clear. Infant vigorous with good spontaneous cry and tone. Needed only minimal bulb suctioning. Ap 8/9. Lungs clear to ausc in DR. Viewed by parents in OR, then transported to the NICU in room air with her father in attendance.  Eidan Muellner, MD  

## 2012-07-04 NOTE — Anesthesia Postprocedure Evaluation (Signed)
Anesthesia Post Note  Patient: Amanda Campos  Procedure(s) Performed: Procedure(s) (LRB): CESAREAN SECTION (N/A)  Anesthesia type: Spinal  Patient location: PACU  Post pain: Pain level controlled  Post assessment: Post-op Vital signs reviewed  Last Vitals:  Filed Vitals:   07/04/12 1056  BP:   Pulse:   Temp:   Resp: 20    Post vital signs: Reviewed  Level of consciousness: awake  Complications: No apparent anesthesia complications

## 2012-07-04 NOTE — Transfer of Care (Signed)
Immediate Anesthesia Transfer of Care Note  Patient: Amanda Campos  Procedure(s) Performed: Procedure(s) (LRB) with comments: CESAREAN SECTION (N/A)  Patient Location: PACU  Anesthesia Type:Spinal  Level of Consciousness: awake, alert  and oriented  Airway & Oxygen Therapy: Patient Spontanous Breathing  Post-op Assessment: Report given to PACU RN and Post -op Vital signs reviewed and stable  Post vital signs: Reviewed and stable  Complications: No apparent anesthesia complications

## 2012-07-04 NOTE — Progress Notes (Signed)
Patient ID: Amanda Campos, female   DOB: 1990-10-28, 21 y.o.   MRN: [id number] Discussed with  MFM and it was decided  Pt 34 weeks and 5 days  IUGR oligohydramnios and  24 hr protein protein greater than  2400 gm patient is having contractions  To be delivered by cs today because of pih

## 2012-07-04 NOTE — Op Note (Signed)
preop diagnosis PIH at 34 weeks and 6 days breech presentation her to labor IUGR oligohydramnios Postop diagnosis primary low transverse cesarean section Anesthesia spinal Procedure patient placed on the operating table in the supine position after the spinal administered  Abdomen prepped and draped bladder emptied with a Foley catheter a transverse suprapubic incision made carried down to the fascia fascia cleaned and incised the length of the incision recti muscles retracted laterally peritoneum incised longitudinally transverse incision made on the visceroperitoneum above the bladder and the bladder mobilized inferiorly transverse low uterine incision made and there was a frank breech presentation the buttocks were delivered and then a moist a little placed across the buttocks and the fetus delivered to the level of the shoulder then by rotation to the left and the right in the arms were delivered without difficulty and the head was delivered with a finger in the mouth Apgars were 8 and 9 the team in attendance the placenta removed manually and sent to pathology uterus cleaned with moist laps the uterine incision closed in one layer with continuous looped abnormal one chromic hemostasis satisfactory bladder flap reattached to a chromic uterus well contracted tubes and ovaries normal abdomen closed in layers peritoneum continuous with of 0 chromic fascia continuous with of 0 Dexon and the skin shows a subcuticular stitch of 4-0 Monocryl blood loss was 400 cc patient tolerated the procedure well taken to the recovery room in good condition dictated by Dr. Francoise Ceo

## 2012-07-04 NOTE — Anesthesia Preprocedure Evaluation (Signed)
Anesthesia Evaluation  Patient identified by MRN, date of birth, ID band Patient awake    Reviewed: Allergy & Precautions, H&P , NPO status , Patient's Chart, lab work & pertinent test results  Airway Mallampati: II TM Distance: >3 FB Neck ROM: full    Dental No notable dental hx.    Pulmonary neg pulmonary ROS,    Pulmonary exam normal       Cardiovascular     Neuro/Psych negative neurological ROS  negative psych ROS   GI/Hepatic negative GI ROS, Neg liver ROS,   Endo/Other  negative endocrine ROS  Renal/GU negative Renal ROS  negative genitourinary   Musculoskeletal negative musculoskeletal ROS (+)   Abdominal Normal abdominal exam  (+)   Peds negative pediatric ROS (+)  Hematology negative hematology ROS (+)   Anesthesia Other Findings   Reproductive/Obstetrics (+) Pregnancy                           Anesthesia Physical Anesthesia Plan  ASA: II  Anesthesia Plan: Spinal   Post-op Pain Management:    Induction:   Airway Management Planned:   Additional Equipment:   Intra-op Plan:   Post-operative Plan:   Informed Consent: I have reviewed the patients History and Physical, chart, labs and discussed the procedure including the risks, benefits and alternatives for the proposed anesthesia with the patient or authorized representative who has indicated his/her understanding and acceptance.     Plan Discussed with:   Anesthesia Plan Comments:         Anesthesia Quick Evaluation

## 2012-07-04 NOTE — Progress Notes (Signed)
Patient ID: Amanda Campos, female   DOB: 07-08-1991, 21 y.o.   MRN: [id number] Diastolic blood pressure 92 this a.m. And the positive for all is in as diastolic was 100 05 24 urine protein 2400 g magnesium sulfate was discontinued yesterday and him a carotid ultrasound yesterday breech presentation with oligohydramnios will talk with MFM today

## 2012-07-05 ENCOUNTER — Encounter (HOSPITAL_COMMUNITY): Payer: Self-pay | Admitting: Obstetrics

## 2012-07-05 LAB — CBC
HCT: 23.4 % — ABNORMAL LOW (ref 36.0–46.0)
Hemoglobin: 7 g/dL — ABNORMAL LOW (ref 12.0–15.0)
MCH: 20.2 pg — ABNORMAL LOW (ref 26.0–34.0)
MCV: 67.4 fL — ABNORMAL LOW (ref 78.0–100.0)
Platelets: 197 10*3/uL (ref 150–400)
RBC: 3.47 MIL/uL — ABNORMAL LOW (ref 3.87–5.11)
WBC: 12.5 10*3/uL — ABNORMAL HIGH (ref 4.0–10.5)

## 2012-07-05 LAB — TYPE AND SCREEN
ABO/RH(D): O NEG
Antibody Screen: NEGATIVE
Unit division: 0
Unit division: 0

## 2012-07-05 LAB — CULTURE, BETA STREP (GROUP B ONLY)

## 2012-07-05 MED ORDER — LABETALOL HCL 200 MG PO TABS
200.0000 mg | ORAL_TABLET | Freq: Two times a day (BID) | ORAL | Status: DC
  Administered 2012-07-05 (×2): 200 mg via ORAL
  Filled 2012-07-05 (×3): qty 1

## 2012-07-05 MED ORDER — RHO D IMMUNE GLOBULIN 1500 UNIT/2ML IJ SOLN
300.0000 ug | Freq: Once | INTRAMUSCULAR | Status: AC
  Administered 2012-07-05: 300 ug via INTRAMUSCULAR
  Filled 2012-07-05: qty 2

## 2012-07-05 NOTE — Progress Notes (Signed)
Patient ID: Amanda Campos, female   DOB: [birth year], 21 y.o.   MRN: [id number] Postop day 1 Blood pressure  128/82   output good Fundus firm Incision clean and dry 2+ edema No complaints continue magnesium sulfate for 24 hours

## 2012-07-05 NOTE — Progress Notes (Signed)
Dr.Marshall gave telephone order to continue Magnesium until tomorrow morning, and order received for po Labetalol. Patient informed of this, upset but accepting.

## 2012-07-05 NOTE — Progress Notes (Signed)
Pt. Refused meds (ibuprofen) doesn't want to take on empty stomach but doesn't feel like eating anything right now

## 2012-07-06 LAB — RH IG WORKUP (INCLUDES ABO/RH)
Fetal Screen: NEGATIVE
Unit division: 0

## 2012-07-06 MED ORDER — LABETALOL HCL 200 MG PO TABS
200.0000 mg | ORAL_TABLET | Freq: Three times a day (TID) | ORAL | Status: DC
  Administered 2012-07-06 – 2012-07-07 (×4): 200 mg via ORAL
  Filled 2012-07-06 (×7): qty 1

## 2012-07-06 NOTE — Progress Notes (Signed)
UR completed 

## 2012-07-06 NOTE — Progress Notes (Signed)
Patient ID: Amanda Campos, female   DOB: [birth year], 21 y.o.   MRN: [id number] Postop day 2 Blood pressure 01 28/78 magnesium discontinued at 4 AM today she is on labetalol 200 mg by mouth q. 8 hours output good no complaints she transferred to the regular unit today if stable and

## 2012-07-06 NOTE — Clinical Social Work Psychosocial (Signed)
Clinical Social Work Department PSYCHOSOCIAL ASSESSMENT - MATERNAL/CHILD 07/06/2012  Patient:  Amanda Campos,Amanda Campos  Account Number:  [id number]  Admit Date:  07/04/2012  Childs Name:   Amanda Campos    Clinical Social Worker:  Morgen Ritacco GRIER Heather Mckendree, LCSWA   Date/Time:  07/06/2012 11:55 AM  Date Referred:  07/05/2012   Referral source  Physician     Referred reason  LPNC  NICU   Other referral source:    I:  FAMILY / HOME ENVIRONMENT Child's legal guardian:  PARENT  Guardian - Name Guardian - Age Guardian - Address  [name] [address] [place], [place] [address]  Dejon Campos [address] [place], [place]   Other household support members/support persons Name Relationship DOB  Mary Sykes AUNT    Other support:   good from extended family from both parents    II  PSYCHOSOCIAL DATA Information Source:  Family Interview  Financial and Community Resources Employment:   both parents are students in college.  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  FOB plans to look for local employment.   Financial resources:  Medicaid If Medicaid - County:    School / Grade:  MOB/[employer]. FOB/[employer] Maternity Care Coordinator / Child Services Coordination / Early Interventions:  Cultural issues impacting care:   none noted    III  STRENGTHS Strengths  Adequate Resources  Compliance with medical plan  Supportive family/friends   Strength comment:  parents apprear to be developing plan to parent baby.   IV  RISK FACTORS AND CURRENT PROBLEMS Current Problem:  None   Risk Factor & Current Problem Patient Issue Family Issue Risk Factor / Current Problem Comment   N N     V  SOCIAL WORK ASSESSMENT CSW met with both parents in MOB's AICU room to complete assessment due to very limited PNC.  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 She reports she went  to her school clinic several times for a check up, but had to pay out of pocket. MOB also stated she had a delay in her medicaid becoming active. She denies any drug or ETOH use while pregnant. Parents have been proactive and already called [facility] to arrange follow up care. They have begun to make some preps at home.  MoB plans to go to her aunt's house here in [place] and live there. FOB lives with his grandmother. Both have extended family in the area for support.      VI SOCIAL WORK PLAN Social Work Plan  Information/Referral to Community Resources  Patient/Family Education  Psychosocial Support/Ongoing Assessment of Needs   Type of pt/family education:   Parents appear to need assistance in learning baby care.   If child protective services report - county:   If child protective services report - date:   Information/referral to community resources comment:   Other social work plan:   CSW will follow and check MDS. Parents made aware of testing due to LPNC. Parents appear appropriate. CSW will check in and reassess needs.     Grier Tiger Spieker, LCSW  Coverning NICU for [facility] M-F 8am-12pm  

## 2012-07-07 MED ORDER — OXYCODONE-ACETAMINOPHEN 5-325 MG PO TABS: 1.0000 | ORAL_TABLET | ORAL | Status: DC | PRN

## 2012-07-07 MED ORDER — LABETALOL HCL 200 MG PO TABS: 200.0000 mg | ORAL_TABLET | Freq: Three times a day (TID) | ORAL | Status: DC

## 2012-07-07 MED ORDER — FUSION PLUS PO CAPS: 1.0000 | ORAL_CAPSULE | Freq: Every day | ORAL | Status: DC

## 2012-07-07 MED ORDER — IBUPROFEN 600 MG PO TABS: 600.0000 mg | ORAL_TABLET | Freq: Four times a day (QID) | ORAL | Status: DC | PRN

## 2012-07-07 NOTE — Plan of Care (Signed)
Problem: Discharge Progression Outcomes Goal: Barriers To Progression Addressed/Resolved Outcome: Completed/Met Date Met:  07/07/12 Continues to have some Bp elevations but they are better.DTR's wnl. Goal: Discharge plan in place and appropriate Outcome: Completed/Met Date Met:  07/07/12 VSS Pain controlled Understands when to call MD F/U care DTR's wnl Diuresing well

## 2012-07-07 NOTE — Progress Notes (Signed)
Subjective: Postpartum Day 3: Cesarean Delivery Patient reports tolerating PO and + flatus.    Objective: Vital signs in last 24 hours: Temp:  [97.7 F (36.5 C)-98.5 F (36.9 C)] 97.7 F (36.5 C) (12/21 1000) Pulse Rate:  [89-102] 95  (12/21 1000) Resp:  [18-20] 20  (12/21 1000) BP: (132-157)/(89-94) 147/93 mmHg (12/21 1000) SpO2:  [97 %-100 %] 100 % (12/21 1000) Weight:  [202 lb 2 oz (91.683 kg)] 202 lb 2 oz (91.683 kg) (12/21 1610)  Physical Exam:  General: alert and no distress Lochia: appropriate Uterine Fundus: firm Incision: healing well DVT Evaluation: No evidence of DVT seen on physical exam.   Basename 07/05/12 0605  HGB 7.0*  HCT 23.4*    Assessment/Plan: Status post Cesarean section. Doing well postoperatively.   Anemia.  Clinically stable. Discharge home with standard precautions and return to clinic in 4-6 weeks.  HARPER,CHARLES A 07/07/2012, 12:24 PM

## 2012-07-07 NOTE — Discharge Summary (Signed)
Obstetric Discharge Summary Reason for Admission: [facility] Prenatal Procedures: NST, Preeclampsia and ultrasound Intrapartum Procedures: cesarean: low cervical, transverse Postpartum Procedures: Magnesium sulfate, Labetalol Complications-Operative and Postpartum: [facility] Hemoglobin  Date Value Range Status  07/05/2012 7.0* 12.0 - 15.0 g/dL Final     HCT  Date Value Range Status  07/05/2012 23.4* 36.0 - 46.0 % Final    Physical Exam:  General: alert and no distress Lochia: appropriate Uterine Fundus: firm Incision: healing well DVT Evaluation: No evidence of DVT seen on physical exam.  Discharge Diagnoses: Term Pregnancy-delivered.  [facility].  Severe anemia.    Discharge Information: Date: 07/07/2012 Activity: pelvic rest Diet: routine Medications: PNV, Ibuprofen, Colace, Iron, Percocet and Labetalol, Fusion Plus Condition: stable Instructions: refer to practice specific booklet Discharge to: home Follow-up Information    Follow up with MARSHALL,BERNARD A, MD. Schedule an appointment as soon as possible for a visit in 2 weeks.   Contact information:   [address] [place] [address] [phone]          Newborn Data: Live born female  Birth Weight: 5 lb 3.8 oz (2376 g) APGAR: 8, 9  Baby in NICU.  HARPER,CHARLES A 07/07/2012, 12:47 PM

## 2012-07-07 NOTE — Progress Notes (Signed)
Discharge instructions provided to patient and significant other at bedside.  Medications, activity instructions, follow up appointments, community resources discussed.  No questions at this time.  Patient left unit with personal belongings in stable condition accompanied by staff.  Osvaldo Angst, RN---------------

## 2012-09-02 ENCOUNTER — Emergency Department (HOSPITAL_BASED_OUTPATIENT_CLINIC_OR_DEPARTMENT_OTHER): Payer: Medicaid Other

## 2012-09-02 ENCOUNTER — Emergency Department (HOSPITAL_BASED_OUTPATIENT_CLINIC_OR_DEPARTMENT_OTHER)
Admission: EM | Admit: 2012-09-02 | Discharge: 2012-09-02 | Disposition: A | Discharge status: Home / Self Care | Payer: Medicaid Other | Attending: Emergency Medicine | Admitting: Emergency Medicine

## 2012-09-02 ENCOUNTER — Encounter (HOSPITAL_BASED_OUTPATIENT_CLINIC_OR_DEPARTMENT_OTHER): Payer: Self-pay | Admitting: *Deleted

## 2012-09-02 ENCOUNTER — Other Ambulatory Visit: Payer: Self-pay

## 2012-09-02 DIAGNOSIS — Z87440 Personal history of urinary (tract) infections: Secondary | ICD-10-CM | POA: Insufficient documentation

## 2012-09-02 DIAGNOSIS — R0602 Shortness of breath: Secondary | ICD-10-CM

## 2012-09-02 DIAGNOSIS — R5381 Other malaise: Secondary | ICD-10-CM | POA: Insufficient documentation

## 2012-09-02 DIAGNOSIS — R064 Hyperventilation: Secondary | ICD-10-CM

## 2012-09-02 DIAGNOSIS — R5383 Other fatigue: Secondary | ICD-10-CM | POA: Insufficient documentation

## 2012-09-02 DIAGNOSIS — R51 Headache: Secondary | ICD-10-CM | POA: Insufficient documentation

## 2012-09-02 DIAGNOSIS — R079 Chest pain, unspecified: Secondary | ICD-10-CM

## 2012-09-02 DIAGNOSIS — R110 Nausea: Secondary | ICD-10-CM | POA: Insufficient documentation

## 2012-09-02 LAB — CBC WITH DIFFERENTIAL/PLATELET
Basophils Relative: 0 % (ref 0–1)
Eosinophils Relative: 0 % (ref 0–5)
HCT: 29.9 % — ABNORMAL LOW (ref 36.0–46.0)
Hemoglobin: 9 g/dL — ABNORMAL LOW (ref 12.0–15.0)
Lymphs Abs: 1.2 10*3/uL (ref 0.7–4.0)
MCH: 19.7 pg — ABNORMAL LOW (ref 26.0–34.0)
MCV: 65.3 fL — ABNORMAL LOW (ref 78.0–100.0)
Monocytes Absolute: 0.8 10*3/uL (ref 0.1–1.0)
RBC: 4.58 MIL/uL (ref 3.87–5.11)

## 2012-09-02 LAB — BASIC METABOLIC PANEL
BUN: 5 mg/dL — ABNORMAL LOW (ref 6–23)
Chloride: 107 mEq/L (ref 96–112)
GFR calc Af Amer: >90 mL/min (ref >90)
Glucose, Bld: 120 mg/dL — ABNORMAL HIGH (ref 70–99)
Potassium: 3.9 mEq/L (ref 3.5–5.1)

## 2012-09-02 MED ORDER — IOHEXOL 350 MG/ML SOLN
100.0000 mL | Freq: Once | INTRAVENOUS | Status: AC | PRN
  Administered 2012-09-02: 100 mL via INTRAVENOUS

## 2012-09-02 MED ORDER — ONDANSETRON HCL 4 MG/2ML IJ SOLN
4.0000 mg | Freq: Once | INTRAMUSCULAR | Status: AC
  Administered 2012-09-02: 4 mg via INTRAVENOUS
  Filled 2012-09-02: qty 2

## 2012-09-02 MED ORDER — HYDROMORPHONE HCL PF 1 MG/ML IJ SOLN
1.0000 mg | Freq: Once | INTRAMUSCULAR | Status: AC
  Administered 2012-09-02: 1 mg via INTRAVENOUS
  Filled 2012-09-02: qty 1

## 2012-09-02 MED ORDER — NAPROXEN 500 MG PO TABS: 500.0000 mg | ORAL_TABLET | Freq: Two times a day (BID) | ORAL | Status: DC

## 2012-09-02 MED ORDER — SODIUM CHLORIDE 0.9 % IV SOLN
INTRAVENOUS | Status: DC
  Administered 2012-09-02: 04:00:00 via INTRAVENOUS

## 2012-09-02 NOTE — ED Notes (Signed)
Pt. Got up to the bathroom and c/o pain to anterior chest with movement. Requested pain meds. Will medicate per order.

## 2012-09-02 NOTE — ED Notes (Signed)
Pt resting quietly at present.  Will medicate as needed for pain.

## 2012-09-02 NOTE — ED Notes (Signed)
Returned from xray

## 2012-09-02 NOTE — ED Notes (Signed)
Returned from CT.

## 2012-09-02 NOTE — ED Notes (Signed)
Transported to CT 

## 2012-09-02 NOTE — ED Notes (Signed)
MD with pt  

## 2012-09-02 NOTE — ED Notes (Signed)
Transported to xray 

## 2012-09-02 NOTE — ED Provider Notes (Addendum)
History     CSN: [id number]  Arrival date & time 09/02/12  0056   First MD Initiated Contact with Patient 09/02/12 0106      Chief Complaint  Patient presents with  . Shortness of Breath    (Consider location/radiation/quality/duration/timing/severity/associated sxs/prior treatment) The history is provided by the patient and the EMS personnel.   22 year old female postpartum 2 months. Acute onset at 7 in the morning yesterday that would be Saturday morning of substernal upper chest chest pain. This was chest wall in nature only present with taking a deep breath or breathing. No cough no fever no upper respirations symptoms. 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 Brought in by EMS. Room air sats hearing ED about 100%. Patient's symptoms are associated with some nausea and a mild headache.  Past Medical History  Diagnosis Date  . UTI (lower urinary tract infection)   . Pregnancy induced hypertension   . Infection 2011    kidney infection, was treated    Past Surgical History  Procedure Laterality Date  . Ganglion cyst excision    . Cesarean section  07/04/2012    Procedure: CESAREAN SECTION;  Surgeon: Kathreen Cosier, MD;  Location: [facility];  Service: Obstetrics;  Laterality: N/A;    No family history on file.  History  Substance Use Topics  . Smoking status: Never Smoker   . Smokeless tobacco: Never Used  . Alcohol Use: No    OB History   Grav Para Term Preterm Abortions TAB SAB Ect Mult Living   1 1 0 1 0 0 0 0 0 1       Review of Systems  Constitutional: Positive for fatigue. Negative for fever, chills and diaphoresis.  HENT: Negative for congestion and neck pain.   Eyes: Negative for visual disturbance.  Respiratory: Positive for shortness of breath. Negative for cough.   Cardiovascular: Positive for chest pain.  Gastrointestinal: Positive for nausea. Negative for vomiting and abdominal  pain.  Genitourinary: Negative for dysuria.  Musculoskeletal: Negative for back pain.  Skin: Negative for rash.  Neurological: Negative for dizziness, syncope and headaches.  Hematological: Does not bruise/bleed easily.    Allergies  Review of patient's allergies indicates no known allergies.  Home Medications   Current Outpatient Rx  Name  Route  Sig  Dispense  Refill  . ibuprofen (ADVIL,MOTRIN) 600 MG tablet   Oral   Take 1 tablet (600 mg total) by mouth every 6 (six) hours as needed for pain.   30 tablet   5   . Iron-FA-B Cmp-C-Biot-Probiotic (FUSION PLUS) CAPS   Oral   Take 1 capsule by mouth daily before breakfast.   30 capsule   5   . labetalol (NORMODYNE) 200 MG tablet   Oral   Take 1 tablet (200 mg total) by mouth 3 (three) times daily.   90 tablet   0   . naproxen (NAPROSYN) 500 MG tablet   Oral   Take 1 tablet (500 mg total) by mouth 2 (two) times daily.   14 tablet   0   . oxyCODONE-acetaminophen (PERCOCET/ROXICET) 5-325 MG per tablet   Oral   Take 1-2 tablets by mouth every 4 (four) hours as needed (moderate - severe pain).   40 tablet   0     BP 121/75  Pulse 84  Temp(Src) 100.1 F (37.8 C) (Oral)  Resp 18  Wt 150 lb (68.04 kg)  BMI  25.73 kg/m2  SpO2 100%  Physical Exam  Nursing note and vitals reviewed. Constitutional: She is oriented to person, place, and time. She appears well-developed and well-nourished.  HENT:  Head: Normocephalic and atraumatic.  Mouth/Throat: Oropharynx is clear and moist.  Eyes: Conjunctivae and EOM are normal. Pupils are equal, round, and reactive to light.  Neck: Normal range of motion. Neck supple.  Cardiovascular: Normal rate, regular rhythm, normal heart sounds and intact distal pulses.   No murmur heard. Pulmonary/Chest: Effort normal and breath sounds normal. No respiratory distress. She has no wheezes. She has no rales. She exhibits no tenderness.  Abdominal: Soft. Bowel sounds are normal. There is no  tenderness.  Musculoskeletal: Normal range of motion.  Neurological: She is alert and oriented to person, place, and time. No cranial nerve deficit. She exhibits normal muscle tone. Coordination normal.  Skin: Skin is warm. No rash noted.    ED Course  Procedures (including critical care time)  Labs Reviewed  D-DIMER, QUANTITATIVE - Abnormal; Notable for the following:    D-Dimer, Quant 0.95 (*)    All other components within normal limits  CBC WITH DIFFERENTIAL - Abnormal; Notable for the following:    WBC 13.8 (*)    Hemoglobin 9.0 (*)    HCT 29.9 (*)    MCV 65.3 (*)    MCH 19.7 (*)    RDW 18.8 (*)    Neutrophils Relative 85 (*)    Lymphocytes Relative 9 (*)    Neutro Abs 11.8 (*)    All other components within normal limits  BASIC METABOLIC PANEL - Abnormal; Notable for the following:    Glucose, Bld 120 (*)    BUN 5 (*)    All other components within normal limits   Dg Chest 2 View  09/02/2012  *RADIOLOGY REPORT*  Clinical Data: Short of breath  CHEST - 2 VIEW  Comparison: None  Findings: The heart size and mediastinal contours are within normal limits.  Both lungs are clear.  The visualized skeletal structures are unremarkable.  IMPRESSION: Negative examination.   Original Report Authenticated By: Signa Kell, M.D.    Ct Angio Chest Pe W/cm &/or Wo Cm  09/02/2012  *RADIOLOGY REPORT*  Clinical Data: Shortness of breath; pain on inspiration.  Elevated D-dimer.  CT ANGIOGRAPHY CHEST  Technique:  Multidetector CT imaging of the chest using the standard protocol during bolus administration of intravenous contrast. Multiplanar reconstructed images including MIPs were obtained and reviewed to evaluate the vascular anatomy.  Contrast: OMNIPAQUE IOHEXOL 350 MG/ML SOLN  Comparison: Chest radiograph performed earlier today at 01:29 a.m.  Findings: There is no evidence of pulmonary embolus.  Minimal bilateral dependent subsegmental atelectasis is noted.  The lungs are otherwise  clear.  There is no evidence of significant focal consolidation, pleural effusion or pneumothorax.  No masses are identified; no abnormal focal contrast enhancement is seen.  The mediastinum is unremarkable in appearance.  No mediastinal lymphadenopathy is seen.  No pericardial effusion is identified. The great vessels are grossly unremarkable in appearance.  No axillary lymphadenopathy is seen.  The visualized portions of the thyroid gland are unremarkable in appearance.  The visualized portions of the liver and spleen are unremarkable. The visualized portions of the pancreas, gallbladder, stomach, adrenal glands and kidneys are within normal limits.  Slightly asymmetrically prominent fibroglandular tissue is noted underlying the left nipple; this density of fibroglandular tissue likely remains within normal limits given the patient's age.  No acute osseous abnormalities are seen.  IMPRESSION:  1.  No evidence of pulmonary embolus. 2.  Minimal bilateral dependent subsegmental atelectasis noted; lungs otherwise clear.   Original Report Authenticated By: Tonia Ghent, M.D.      Date: 09/02/2012  Rate: 90  Rhythm: normal sinus rhythm  QRS Axis: normal  Intervals: normal  ST/T Wave abnormalities: nonspecific T wave changes  Conduction Disutrbances:none  Narrative Interpretation:   Old EKG Reviewed: none available  Results for orders placed during the hospital encounter of 09/02/12  D-DIMER, QUANTITATIVE      Result Value Range   D-Dimer, Quant 0.95 (*) 0.00 - 0.48 ug/mL-FEU  CBC WITH DIFFERENTIAL      Result Value Range   WBC 13.8 (*) 4.0 - 10.5 K/uL   RBC 4.58  3.87 - 5.11 MIL/uL   Hemoglobin 9.0 (*) 12.0 - 15.0 g/dL   HCT 16.1 (*) 09.6 - 04.5 %   MCV 65.3 (*) 78.0 - 100.0 fL   MCH 19.7 (*) 26.0 - 34.0 pg   MCHC 30.1  30.0 - 36.0 g/dL   RDW 40.9 (*) 81.1 - 91.4 %   Platelets 327  150 - 400 K/uL   Neutrophils Relative 85 (*) 43 - 77 %   Lymphocytes Relative 9 (*) 12 - 46 %   Monocytes  Relative 6  3 - 12 %   Eosinophils Relative 0  0 - 5 %   Basophils Relative 0  0 - 1 %   Neutro Abs 11.8 (*) 1.7 - 7.7 K/uL   Lymphs Abs 1.2  0.7 - 4.0 K/uL   Monocytes Absolute 0.8  0.1 - 1.0 K/uL   Eosinophils Absolute 0.0  0.0 - 0.7 K/uL   Basophils Absolute 0.0  0.0 - 0.1 K/uL   RBC Morphology TEARDROP CELLS     WBC Morphology VACUOLATED NEUTROPHILS     Smear Review PLATELET COUNT CONFIRMED BY SMEAR    BASIC METABOLIC PANEL      Result Value Range   Sodium 139  135 - 145 mEq/L   Potassium 3.9  3.5 - 5.1 mEq/L   Chloride 107  96 - 112 mEq/L   CO2 22  19 - 32 mEq/L   Glucose, Bld 120 (*) 70 - 99 mg/dL   BUN 5 (*) 6 - 23 mg/dL   Creatinine, Ser 7.82  0.50 - 1.10 mg/dL   Calcium 9.7  8.4 - 95.6 mg/dL   GFR calc non Af Amer >90  >90 mL/min   GFR calc Af Amer >90  >90 mL/min     1. Chest pain   2. Shortness of breath   3. Hyperventilation       MDM   Patient with onset of chest pain and now yesterday morning at 7 in the morning. Pain predominantly chest wall in nature pleuritic in nature only when breathing or taking a deep breath. 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  Chest x-ray negative for pneumonia or pneumothorax. Patient is 2 months postpartum d-dimer was elevated so CT angiogram rule out pulmonary embolism has been ordered. Patient's renal function is normal patient is not breast-feeding. Mild leukocytosis white count 13,800 also low-grade fever in the emergency apartment 100.1. EKG without any acute changes. Cardiac monitor patient is not tachycardic. Room air saturation is 100%. Patient feels better in the emergency department however still has some pain when she takes deep breaths.  CT angios negative for pulmonary embolism. Also  negative for pneumonia. Patient's symptoms are consistent with chest wall pain. Will treat with anti-inflammatories.     Shelda Jakes,  MD 09/02/12 1610  Shelda Jakes, MD 09/02/12 [phone]

## 2012-09-02 NOTE — ED Notes (Addendum)
C/o anterior chest pain that hurts with inspiration. Describes as a soreness. States she is 2 months post partum and had b/p issues during pregnancy. Denies any recent illness or cough. C/o nausea denies vomiting. Denies diarrhea. Denies any recent long trips.

## 2012-09-02 NOTE — ED Notes (Signed)
Pt. States that she does not want any pain medications at this time. Instructed to call me if pain returned.

## 2013-01-03 ENCOUNTER — Emergency Department (HOSPITAL_COMMUNITY)
Admission: EM | Admit: 2013-01-03 | Discharge: 2013-01-03 | Disposition: A | Discharge status: Home / Self Care | Payer: Self-pay | Attending: Emergency Medicine | Admitting: Emergency Medicine

## 2013-01-03 ENCOUNTER — Encounter (HOSPITAL_COMMUNITY): Payer: Self-pay | Admitting: Emergency Medicine

## 2013-01-03 DIAGNOSIS — Z87440 Personal history of urinary (tract) infections: Secondary | ICD-10-CM | POA: Insufficient documentation

## 2013-01-03 DIAGNOSIS — H109 Unspecified conjunctivitis: Secondary | ICD-10-CM | POA: Insufficient documentation

## 2013-01-03 MED ORDER — POLYMYXIN B-TRIMETHOPRIM 10000-0.1 UNIT/ML-% OP SOLN: 1.0000 [drp] | OPHTHALMIC | Status: DC

## 2013-01-03 NOTE — Discharge Instructions (Signed)
Use eyedrops as directed until symptoms resolve. Refer to attached documents for more information °

## 2013-01-03 NOTE — ED Provider Notes (Signed)
History     CSN: [id number]  Arrival date & time 01/03/13  1257   First MD Initiated Contact with Patient 01/03/13 1310      Chief Complaint  Patient presents with  . eye infection     (Consider location/radiation/quality/duration/timing/severity/associated sxs/prior treatment) HPI Comments: Patient is a 22 year old female who presents with bilateral eye redness for the past 2 days. Patient reports gradual onset of symptoms and progressive worsening. Patient reports associated drainage and irritation. No other associated symptoms. No aggravating/alleviating factors. Patient denies any sick contacts.    Past Medical History  Diagnosis Date  . UTI (lower urinary tract infection)   . Pregnancy induced hypertension   . Infection 2011    kidney infection, was treated    Past Surgical History  Procedure Laterality Date  . Ganglion cyst excision    . Cesarean section  07/04/2012    Procedure: CESAREAN SECTION;  Surgeon: Kathreen Cosier, MD;  Location: [facility];  Service: Obstetrics;  Laterality: N/A;    No family history on file.  History  Substance Use Topics  . Smoking status: Never Smoker   . Smokeless tobacco: Never Used  . Alcohol Use: No    OB History   Grav Para Term Preterm Abortions TAB SAB Ect Mult Living   1 1 0 1 0 0 0 0 0 1       Review of Systems  Eyes: Positive for pain and discharge.  All other systems reviewed and are negative.    Allergies  Review of patient's allergies indicates no known allergies.  Home Medications  No current outpatient prescriptions on file.  BP 110/67  Pulse 83  Temp(Src) 98.3 F (36.8 C) (Oral)  Resp 20  SpO2 100%  Physical Exam  Nursing note and vitals reviewed. Constitutional: She is oriented to person, place, and time. She appears well-developed and well-nourished. No distress.  HENT:  Head: Normocephalic and atraumatic.  Eyes: EOM are normal. Pupils are equal, round, and reactive to light.  Bilateral  conjunctival injection. No photophobia noted.   Neck: Normal range of motion.  Cardiovascular: Normal rate and regular rhythm.  Exam reveals no gallop and no friction rub.   No murmur heard. Pulmonary/Chest: Effort normal and breath sounds normal. She has no wheezes. She has no rales. She exhibits no tenderness.  Abdominal: Soft. There is no tenderness.  Musculoskeletal: Normal range of motion.  Neurological: She is alert and oriented to person, place, and time.  Speech is goal-oriented. Moves limbs without ataxia.   Skin: Skin is warm and dry.  Psychiatric: She has a normal mood and affect. Her behavior is normal.    ED Course  Procedures (including critical care time)  Labs Reviewed - No data to display No results found.   1. Conjunctivitis       MDM  1:55 PM Patient has conjunctivitis. Patient will be discharged with antiiotic eyedrops. Vitals stable and patient afebrile.         Emilia Beck, [place] 01/05/13 [phone]

## 2013-01-03 NOTE — Progress Notes (Signed)
   CARE MANAGEMENT ED NOTE 01/03/2013  Patient:  JASLEEN, RIEPE   Account Number:  [id number]  Date Initiated:  01/03/2013  Documentation initiated by:  Radford Pax  Subjective/Objective Assessment:   Patient presented to ED with conjunctivitis.     Subjective/Objective Assessment Detail:     Action/Plan:   Action/Plan Detail:   Anticipated DC Date:  01/03/2013     Status Recommendation to Physician:   Result of Recommendation:    Other ED Services  Consult Working Plan    DC Planning Services  Other  PCP issues    Choice offered to / List presented to:            Status of service:  Completed, signed off  ED Comments:   ED Comments Detail:  Patient listed as not having a pcp.  Patient stated that there was not a doctor listed on her medicaid card.  EDCM provided patient list of pcp's who accept medicaid insurance.  Instructed patient to call the department of social services after she has found a pcp.  EDCM provided numcber for DSS.  Patient verbalized understanding.  No further needs at this time.

## 2013-01-03 NOTE — ED Notes (Signed)
Per pt, states both eyes were red yesterday-woke up this am with drainage, burning

## 2013-01-05 NOTE — ED Provider Notes (Signed)
  Medical screening examination/treatment/procedure(s) were performed by non-physician practitioner and as supervising physician I was immediately available for consultation/collaboration.  Derwood Kaplan, MD 01/05/13 1221

## 2013-08-02 ENCOUNTER — Encounter (HOSPITAL_COMMUNITY): Payer: Self-pay | Admitting: Emergency Medicine

## 2013-08-02 ENCOUNTER — Emergency Department (HOSPITAL_COMMUNITY)
Admission: EM | Admit: 2013-08-02 | Discharge: 2013-08-02 | Disposition: A | Discharge status: Home / Self Care | Payer: Medicaid Other | Attending: Emergency Medicine | Admitting: Emergency Medicine

## 2013-08-02 DIAGNOSIS — IMO0002 Reserved for concepts with insufficient information to code with codable children: Secondary | ICD-10-CM | POA: Insufficient documentation

## 2013-08-02 DIAGNOSIS — S3994XA Unspecified injury of external genitals, initial encounter: Secondary | ICD-10-CM | POA: Insufficient documentation

## 2013-08-02 DIAGNOSIS — Y929 Unspecified place or not applicable: Secondary | ICD-10-CM | POA: Insufficient documentation

## 2013-08-02 DIAGNOSIS — S39848A Other specified injuries of external genitals, initial encounter: Secondary | ICD-10-CM | POA: Insufficient documentation

## 2013-08-02 DIAGNOSIS — Z3202 Encounter for pregnancy test, result negative: Secondary | ICD-10-CM | POA: Insufficient documentation

## 2013-08-02 DIAGNOSIS — Y9389 Activity, other specified: Secondary | ICD-10-CM | POA: Insufficient documentation

## 2013-08-02 DIAGNOSIS — Z87440 Personal history of urinary (tract) infections: Secondary | ICD-10-CM | POA: Insufficient documentation

## 2013-08-02 DIAGNOSIS — S3993XA Unspecified injury of pelvis, initial encounter: Secondary | ICD-10-CM

## 2013-08-02 DIAGNOSIS — N898 Other specified noninflammatory disorders of vagina: Secondary | ICD-10-CM | POA: Insufficient documentation

## 2013-08-02 LAB — URINE MICROSCOPIC-ADD ON

## 2013-08-02 LAB — URINALYSIS, ROUTINE W REFLEX MICROSCOPIC
Bilirubin Urine: NEGATIVE
GLUCOSE, UA: NEGATIVE mg/dL
KETONES UR: NEGATIVE mg/dL
LEUKOCYTES UA: NEGATIVE
NITRITE: NEGATIVE
Protein, ur: NEGATIVE mg/dL
Specific Gravity, Urine: 1.026 (ref 1.005–1.030)
Urobilinogen, UA: 0.2 mg/dL (ref 0.0–1.0)
pH: 5.5 (ref 5.0–8.0)

## 2013-08-02 LAB — WET PREP, GENITAL
Clue Cells Wet Prep HPF POC: NONE SEEN
Trich, Wet Prep: NONE SEEN
YEAST WET PREP: NONE SEEN

## 2013-08-02 LAB — POCT I-STAT, CHEM 8
BUN: 7 mg/dL (ref 6–23)
CHLORIDE: 104 meq/L (ref 96–112)
CREATININE: 0.9 mg/dL (ref 0.50–1.10)
Calcium, Ion: 1.23 mmol/L (ref 1.12–1.23)
Glucose, Bld: 88 mg/dL (ref 70–99)
HCT: 31 % — ABNORMAL LOW (ref 36.0–46.0)
Hemoglobin: 10.5 g/dL — ABNORMAL LOW (ref 12.0–15.0)
Potassium: 3.6 mEq/L — ABNORMAL LOW (ref 3.7–5.3)
SODIUM: 139 meq/L (ref 137–147)
TCO2: 22 mmol/L (ref 0–100)

## 2013-08-02 LAB — PREGNANCY, URINE: PREG TEST UR: NEGATIVE

## 2013-08-02 MED ORDER — HYDROCORTISONE 1 % EX CREA: TOPICAL_CREAM | CUTANEOUS | Status: DC

## 2013-08-02 MED ORDER — LIDOCAINE HCL 2 % EX GEL
Freq: Once | CUTANEOUS | Status: AC
  Administered 2013-08-02: 14:00:00 via TOPICAL
  Filled 2013-08-02: qty 10

## 2013-08-02 NOTE — ED Notes (Signed)
Pt reports she is unable to provide urine sample at this time. °
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
## 2013-08-02 NOTE — ED Notes (Signed)
Pt made aware of need for urine specimen states can not void at this time

## 2013-08-02 NOTE — Discharge Instructions (Signed)
Vaginal Laceration °A vaginal laceration is a tear in the vaginal wall. A vaginal tear falls into one of three categories:  °1. Obstetrically related tears that occur at the time of childbirth. °2. Trauma-related tears (most often related to sexual intercourse). °3. Spontaneous tears. °Vaginal tears can cause heavy bleeding (hemorrhaging) depending on severity of the tear. Tears can be intensely tender and interfere with normal activities of living. They can make sexual intercourse painful and bring on significant burning with urination. If you have a vaginal laceration, the area around your vagina may be painful when you touch or wipe it. Even light pressure from clothing may cause some pain. Vaginal tear need to be evaluated by your caregiver.   °CAUSES  °· Obstetric-related causes, such as childbirth. °· Trauma that may result from an accident during an activity, such as sexual intercourse or a bicycle ride. °· Spontaneous causes related to aging, failed healing of a past obstetric tear, chronic irritation, or skin changes that are not well understood. °SYMPTOMS  °· Slight to heavy vaginal bleeding. °· Vaginal swelling. °· Mild to severe pain. °· Vaginal tenderness. °DIAGNOSIS  °If the tear happened during childbirth, your caregiver can diagnose the tear at that time. To diagnose a vaginal tear that happened spontaneously or because of trauma, your caregiver will perform a physical exam. During the physical exam, your caregiver may also look for any signs of trouble that may need further testing. If there is hemorrhaging, your caregiver may suggest blood tests to determine the extent of bleeding. Imaging tests may be performed, such as an ultrasonography or computed tomography (CT), to look for internal damage. A biopsy may be need if there are signs of a more serious problem.  °TREATMENT  °Treatment depends on the severity of the tear. For minor tears that heal on their own, treatment may only consist of keeping  the area clean and dry. Some tears need to be repaired with stitches. Other tears may heal on their own with help from various remedies, such as antibiotic ointments, medicated creams, or petroleum products. Depending on the circumstances, oral hormones may also be suggested. Hormone remedies may also be in the form of topical creams and vaginal tablets. For more concerning situations, hospitalization and surgical repair of the tear may be needed. °HOME CARE INSTRUCTIONS  °· Take warm-water baths that cover your hips and buttocks (sitz bath) 2 to 3 times a day. This may help any discomfort and swelling.   °· Only take over-the-counter or prescription medicines for pain, discomfort, or fever as directed by your caregiver. Do not use aspirin because it can cause increased bleeding.   °· Do not douche, use tampons, or have intercourse until your caregiver says it is okay.    °· A bandage (dressing) may have been applied. Change the dressing once a day or as directed. If the dressing sticks, soak it off with warm, soapy water.   °· Apply ice or witch hazel pads to the vagina to lessen any pain or discomfort.   °· Take a stool softener or follow a special diet as directed by your caregiver. This will help ease discomfort associated with bowel movements.   °SEEK IMMEDIATE MEDICAL CARE IF:  °· You have redness or swelling in the vaginal area.   °· You have increasing, sharp, or intense pain or tenderness in the vaginal area. °· You have pus or unusual discharge coming from the tear or vagina.   °· You notice a bad smell coming from the vagina.   °· Your tear breaks open after   it healed or was repaired.   °· You feel lightheaded. °· You have increasing abdominal pain.   °· You have an increasing or heavy amount of vaginal bleeding.   °· You have pain with intercourse after the tear heals.   °MAKE SURE YOU: °· Understand these instructions. °· Will watch your condition. °· Will get help right away if you are not doing well  or get worse. °Document Released: 07/04/2005 Document Revised: 03/28/2012 Document Reviewed: 11/21/2011 °ExitCare® Patient Information ©2014 [employer]. ° °

## 2013-08-02 NOTE — Progress Notes (Signed)
[facility] provided pt with a list of primary care resources, highlighting [facility]. Also, provided pt with [facility] information.

## 2013-08-03 LAB — GC/CHLAMYDIA PROBE AMP
CT Probe RNA: NEGATIVE
GC Probe RNA: NEGATIVE

## 2013-08-05 NOTE — ED Provider Notes (Signed)
Medical screening examination/treatment/procedure(s) were performed by non-physician practitioner and as supervising physician I was immediately available for consultation/collaboration.  EKG Interpretation   None         Richardean Canalavid H Yao, MD 08/05/13 1021

## 2013-08-14 ENCOUNTER — Emergency Department (HOSPITAL_COMMUNITY)
Admission: EM | Admit: 2013-08-14 | Discharge: 2013-08-15 | Disposition: A | Discharge status: Home / Self Care | Payer: Self-pay | Attending: Emergency Medicine | Admitting: Emergency Medicine

## 2013-08-14 ENCOUNTER — Emergency Department (HOSPITAL_COMMUNITY): Payer: Self-pay

## 2013-08-14 ENCOUNTER — Encounter (HOSPITAL_COMMUNITY): Payer: Self-pay | Admitting: Emergency Medicine

## 2013-08-14 DIAGNOSIS — R059 Cough, unspecified: Secondary | ICD-10-CM | POA: Insufficient documentation

## 2013-08-14 DIAGNOSIS — J3489 Other specified disorders of nose and nasal sinuses: Secondary | ICD-10-CM | POA: Insufficient documentation

## 2013-08-14 DIAGNOSIS — Z87440 Personal history of urinary (tract) infections: Secondary | ICD-10-CM | POA: Insufficient documentation

## 2013-08-14 DIAGNOSIS — R0602 Shortness of breath: Secondary | ICD-10-CM | POA: Insufficient documentation

## 2013-08-14 DIAGNOSIS — R509 Fever, unspecified: Secondary | ICD-10-CM | POA: Insufficient documentation

## 2013-08-14 DIAGNOSIS — IMO0002 Reserved for concepts with insufficient information to code with codable children: Secondary | ICD-10-CM | POA: Insufficient documentation

## 2013-08-14 DIAGNOSIS — Z3202 Encounter for pregnancy test, result negative: Secondary | ICD-10-CM | POA: Insufficient documentation

## 2013-08-14 DIAGNOSIS — R05 Cough: Secondary | ICD-10-CM | POA: Insufficient documentation

## 2013-08-14 DIAGNOSIS — R110 Nausea: Secondary | ICD-10-CM | POA: Insufficient documentation

## 2013-08-14 NOTE — ED Notes (Signed)
Pt c/o cough x 1 week. +nausea, +fever at home. A & O.

## 2013-08-15 LAB — POCT PREGNANCY, URINE: Preg Test, Ur: NEGATIVE

## 2013-08-15 MED ORDER — PREDNISONE 20 MG PO TABS: 40.0000 mg | ORAL_TABLET | Freq: Every day | ORAL | Status: DC

## 2013-08-15 MED ORDER — HYDROCODONE-HOMATROPINE 5-1.5 MG/5ML PO SYRP
5.0000 mL | ORAL_SOLUTION | Freq: Four times a day (QID) | ORAL | Status: DC | PRN
Start: 2013-08-15 — End: 2018-05-29

## 2013-08-15 NOTE — Discharge Instructions (Signed)

## 2013-08-15 NOTE — ED Provider Notes (Signed)
Medical screening examination/treatment/procedure(s) were performed by non-physician practitioner and as supervising physician I was immediately available for consultation/collaboration.    Lasheena Frieze M Belenda Alviar, MD 08/15/13 0513 

## 2013-08-15 NOTE — ED Provider Notes (Signed)
CSN: [id number]     Arrival date & time 08/14/13  2251 History   First MD Initiated Contact with Patient 08/15/13 [phone]     Chief Complaint  Patient presents with  . Cough   (Consider location/radiation/quality/duration/timing/severity/associated sxs/prior Treatment) Patient is a 23 y.o. female presenting with cough. The history is provided by the patient. No language interpreter was used.  Cough Cough characteristics:  Dry Severity:  Moderate Onset quality:  Gradual Duration:  1 week Timing:  Intermittent Progression:  Waxing and waning Chronicity:  New Smoker: no   Relieved by:  None tried Worsened by:  Deep breathing Ineffective treatments:  Rest Associated symptoms: fever, shortness of breath (mild, sporadic) and sinus congestion   Associated symptoms: no chest pain, no chills, no diaphoresis, no myalgias, no rash, no rhinorrhea, no sore throat and no wheezing   Associated symptoms comment:  Nausea   Past Medical History  Diagnosis Date  . UTI (lower urinary tract infection)   . Pregnancy induced hypertension   . Infection 2011    kidney infection, was treated   Past Surgical History  Procedure Laterality Date  . Ganglion cyst excision    . Cesarean section  07/04/2012    Procedure: CESAREAN SECTION;  Surgeon: Kathreen CosierBernard A Marshall, MD;  Location: [facility];  Service: Obstetrics;  Laterality: N/A;   No family history on file. History  Substance Use Topics  . Smoking status: Never Smoker   . Smokeless tobacco: Never Used  . Alcohol Use: No   OB History   Grav Para Term Preterm Abortions TAB SAB Ect Mult Living   1 1 0 1 0 0 0 0 0 1      Review of Systems  Constitutional: Positive for fever. Negative for chills and diaphoresis.  HENT: Negative for drooling, rhinorrhea, sore throat and trouble swallowing.   Respiratory: Positive for cough and shortness of breath (mild, sporadic). Negative for chest tightness and wheezing.   Cardiovascular: Negative for chest pain.   Gastrointestinal: Positive for nausea. Negative for vomiting and abdominal pain.  Musculoskeletal: Negative for myalgias.  Skin: Negative for rash.  All other systems reviewed and are negative.    Allergies  Review of patient's allergies indicates no known allergies.  Home Medications   Current Outpatient Rx  Name  Route  Sig  Dispense  Refill  . HYDROcodone-homatropine (HYCODAN) 5-1.5 MG/5ML syrup   Oral   Take 5 mLs by mouth every 6 (six) hours as needed for cough.   120 mL   0   . predniSONE (DELTASONE) 20 MG tablet   Oral   Take 2 tablets (40 mg total) by mouth daily.   10 tablet   0    BP 115/87  Pulse 104  Temp(Src) 98.2 F (36.8 C) (Oral)  Resp 18  Wt 160 lb (72.576 kg)  SpO2 99%  LMP 05/30/2013  Breastfeeding? No  Physical Exam  Nursing note and vitals reviewed. Constitutional: She is oriented to person, place, and time. She appears well-developed and well-nourished. No distress.  HENT:  Head: Normocephalic and atraumatic.  Right Ear: Tympanic membrane, external ear and ear canal normal.  Left Ear: Tympanic membrane, external ear and ear canal normal.  Nose: Nose normal.  Mouth/Throat: Uvula is midline, oropharynx is clear and moist and mucous membranes are normal. No oropharyngeal exudate.  +nasal congestion. Uvula midline and patient tolerating secretions without difficulty. Airway patent. No posterior or pharyngeal erythema or exudates.  Eyes: Conjunctivae and EOM are normal. Pupils are equal,  round, and reactive to light. No scleral icterus.  Neck: Normal range of motion. Neck supple.  Patient moves neck with ease  Cardiovascular: Normal rate, regular rhythm, normal heart sounds and intact distal pulses.   Patient not tachycardic as noted in triage  Pulmonary/Chest: Effort normal and breath sounds normal. No respiratory distress. She has no wheezes. She has no rales.  Sporadic dry nonproductive cough with deep breathing. No retractions or accessory  muscle use. Symmetric chest expansion. No wheezes or rales.  Abdominal: Soft. She exhibits no distension. There is no tenderness. There is no rebound and no guarding.  Musculoskeletal: Normal range of motion.  Neurological: She is alert and oriented to person, place, and time.  Skin: Skin is warm and dry. No rash noted. She is not diaphoretic. No erythema. No pallor.  Psychiatric: She has a normal mood and affect. Her behavior is normal.    ED Course  Procedures (including critical care time) Labs Review Labs Reviewed  POCT PREGNANCY, URINE   Imaging Review Dg Chest 2 View  08/15/2013   CLINICAL DATA:  Cough  EXAM: CHEST  2 VIEW  COMPARISON:  09/02/2012  FINDINGS: The heart size and mediastinal contours are within normal limits. Both lungs are clear. The visualized skeletal structures are unremarkable.  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  IMPRESSION: No active cardiopulmonary disease.   Electronically Signed   By: Ruel Favors M.D.   On: 08/15/2013 00:50    EKG Interpretation   None       MDM   1. Cough    Uncomplicated cough with associated nausea and intermittent fevers. Patient is well and nontoxic appearing, hemodynamically stable, and afebrile in ED today. Oropharynx clear and uvula midline. Patient tolerating secretions without difficulty. No stridor. Lungs clear bilaterally without wheezes or rales. No retractions or accessory muscle use. X-ray negative for focal consolidation or pneumonia. No pneumothorax. Symptoms consistent with viral illness. Patient appropriate for discharge with prescription for Hycodan as well as five-day course of prednisone. Ibuprofen advised for chest discomfort secondary to prolonged coughing. Return precautions provided and patient agreeable to plan with no unaddressed concerns.   Filed Vitals:   08/14/13 2349  BP: 115/87  Pulse: 104   Temp: 98.2 F (36.8 C)  TempSrc: Oral  Resp: 18  Weight: 160 lb (72.576 kg)  SpO2: 99%     Antony Madura, PA-C 08/15/13 [id number]

## 2013-12-16 NOTE — Clinical Documentation Improvement (Signed)
Chart accessed for purpose of chart audit in reference to [place] [employer].  

## 2014-05-19 ENCOUNTER — Encounter (HOSPITAL_COMMUNITY): Payer: Self-pay | Admitting: Emergency Medicine

## 2014-07-23 ENCOUNTER — Emergency Department (HOSPITAL_COMMUNITY): Admission: EM | Admit: 2014-07-23 | Payer: Medicaid Other | Source: Home / Self Care

## 2014-07-23 ENCOUNTER — Emergency Department (HOSPITAL_COMMUNITY)
Admission: EM | Admit: 2014-07-23 | Discharge: 2014-07-23 | Disposition: A | Discharge status: Home / Self Care | Payer: Medicaid Other | Attending: Emergency Medicine | Admitting: Emergency Medicine

## 2014-07-23 ENCOUNTER — Encounter (HOSPITAL_COMMUNITY): Payer: Self-pay | Admitting: Emergency Medicine

## 2014-07-23 DIAGNOSIS — Z87440 Personal history of urinary (tract) infections: Secondary | ICD-10-CM | POA: Insufficient documentation

## 2014-07-23 DIAGNOSIS — Z3202 Encounter for pregnancy test, result negative: Secondary | ICD-10-CM | POA: Insufficient documentation

## 2014-07-23 DIAGNOSIS — Z87448 Personal history of other diseases of urinary system: Secondary | ICD-10-CM | POA: Insufficient documentation

## 2014-07-23 DIAGNOSIS — A6000 Herpesviral infection of urogenital system, unspecified: Secondary | ICD-10-CM

## 2014-07-23 DIAGNOSIS — A6009 Herpesviral infection of other urogenital tract: Secondary | ICD-10-CM | POA: Insufficient documentation

## 2014-07-23 DIAGNOSIS — Z8659 Personal history of other mental and behavioral disorders: Secondary | ICD-10-CM | POA: Insufficient documentation

## 2014-07-23 LAB — URINALYSIS, ROUTINE W REFLEX MICROSCOPIC
BILIRUBIN URINE: NEGATIVE
Glucose, UA: NEGATIVE mg/dL
HGB URINE DIPSTICK: NEGATIVE
Ketones, ur: NEGATIVE mg/dL
Nitrite: NEGATIVE
PROTEIN: 100 mg/dL — AB
Specific Gravity, Urine: 1.031 — ABNORMAL HIGH (ref 1.005–1.030)
Urobilinogen, UA: 1 mg/dL (ref 0.0–1.0)
pH: 8.5 — ABNORMAL HIGH (ref 5.0–8.0)

## 2014-07-23 LAB — WET PREP, GENITAL
Clue Cells Wet Prep HPF POC: NONE SEEN
Trich, Wet Prep: NONE SEEN
YEAST WET PREP: NONE SEEN

## 2014-07-23 LAB — URINE MICROSCOPIC-ADD ON

## 2014-07-23 LAB — POC URINE PREG, ED: Preg Test, Ur: NEGATIVE

## 2014-07-23 LAB — GC/CHLAMYDIA PROBE AMP
CT PROBE, AMP APTIMA: NEGATIVE
GC Probe RNA: NEGATIVE

## 2014-07-23 MED ORDER — TRAMADOL HCL 50 MG PO TABS: 50.0000 mg | ORAL_TABLET | Freq: Four times a day (QID) | ORAL | Status: DC | PRN

## 2014-07-23 MED ORDER — IBUPROFEN 600 MG PO TABS: 600.0000 mg | ORAL_TABLET | Freq: Four times a day (QID) | ORAL | Status: DC | PRN

## 2014-07-23 MED ORDER — ACYCLOVIR 400 MG PO TABS: 400.0000 mg | ORAL_TABLET | Freq: Three times a day (TID) | ORAL | Status: AC

## 2014-07-23 NOTE — Discharge Instructions (Signed)
Genital Herpes °Genital herpes is a sexually transmitted disease. This means that it is a disease passed by having sex with an infected person. There is no cure for genital herpes. The time between attacks can be months to years. The virus may live in a person but produce no problems (symptoms). This infection can be passed to a baby as it travels down the birth canal (vagina). In a newborn, this can cause central nervous system damage, eye damage, or even death. The virus that causes genital herpes is usually HSV-2 virus. The virus that causes oral herpes is usually HSV-1. The diagnosis (learning what is wrong) is made through culture results. °SYMPTOMS  °Usually symptoms of pain and itching begin a few days to a week after contact. It first appears as small blisters that progress to small painful ulcers which then scab over and heal after several days. It affects the outer genitalia, birth canal, cervix, penis, anal area, buttocks, and thighs. °HOME CARE INSTRUCTIONS  °· Keep ulcerated areas dry and clean. °· Take medications as directed. Antiviral medications can speed up healing. They will not prevent recurrences or cure this infection. These medications can also be taken for suppression if there are frequent recurrences. °· While the infection is active, it is contagious. Avoid all sexual contact during active infections. °· Condoms may help prevent spread of the herpes virus. °· Practice safe sex. °· Wash your hands thoroughly after touching the genital area. °· Avoid touching your eyes after touching your genital area. °· Inform your caregiver if you have had genital herpes and become pregnant. It is your responsibility to insure a safe outcome for your baby in this pregnancy. °· Only take over-the-counter or prescription medicines for pain, discomfort, or fever as directed by your caregiver. °SEEK MEDICAL CARE IF:  °· You have a recurrence of this infection. °· You do not respond to medications and are not  improving. °· You have new sources of pain or discharge which have changed from the original infection. °· You have an oral temperature above 102° F (38.9° C). °· You develop abdominal pain. °· You develop eye pain or signs of eye infection. °Document Released: 07/01/2000 Document Revised: 09/26/2011 Document Reviewed: 07/22/2009 °ExitCare® Patient Information ©2015 [employer]. This information is not intended to replace advice given to you by your health care provider. Make sure you discuss any questions you have with your health care provider. ° °

## 2014-07-23 NOTE — ED Notes (Signed)

## 2014-07-23 NOTE — ED Provider Notes (Signed)
Pelvic exam:  Scant grayish cervical discharge without malodor No CMT. No adnexal mass. Minimal right adnexal tenderness.  Arnoldo HookerShari A Keishia Ground, PA-C 07/23/14 0348  Loren Raceravid Yelverton, MD 07/23/14 [phone]

## 2014-07-23 NOTE — ED Notes (Addendum)
All triage entries entered on this pt by this recorder are entered on the wrong chart

## 2014-07-23 NOTE — ED Notes (Signed)
Pt states that she has been having "bumps" that form on the inner lip of her vagina around the time she has her period. States that this time she has noticed that the bumps are also on her finger. Pt also c/o lower quadrant ABD pain and a clear vaginal discharge.

## 2014-07-23 NOTE — ED Provider Notes (Signed)
CSN: [id number]     Arrival date & time 07/23/14  0057 History   First MD Initiated Contact with Patient 07/23/14 0115     Chief Complaint  Patient presents with  . Abdominal Pain  . Rash     (Consider location/radiation/quality/duration/timing/severity/associated sxs/prior Treatment) HPI Patient presents with episodic vesicular rash to the vulva for the past year. She states it normally coincides with the onset of her menstrual cycle. She also noted a similar rash on the distal tip of her left index finger. She's had no fever or chills. She complains of bilateral lower abdominal pain. She states she notices this when lifting heavy boxes. She's had no nausea, vomiting or diarrhea. She states she's had a clear vaginal discharge but denies any current bleeding. Patient sexually active with a single partner and does not use any contraception. Past Medical History  Diagnosis Date  . UTI (lower urinary tract infection)   . Pregnancy induced hypertension   . Infection 2011    kidney infection, was treated  . Bipolar disorder   . Schizophrenic disorder    Past Surgical History  Procedure Laterality Date  . Ganglion cyst excision    . Cesarean section  07/04/2012    Procedure: CESAREAN SECTION;  Surgeon: Kathreen CosierBernard A Marshall, MD;  Location: [facility];  Service: Obstetrics;  Laterality: N/A;  . Tonsillectomy     Family History  Problem Relation Age of Onset  . Family history unknown: Yes   History  Substance Use Topics  . Smoking status: Never Smoker   . Smokeless tobacco: Never Used  . Alcohol Use: Yes     Comment: socially   OB History    Gravida Para Term Preterm AB TAB SAB Ectopic Multiple Living   1 1 0 1 0 0 0 0 0 1      Review of Systems  Constitutional: Negative for fever and chills.  Respiratory: Negative for shortness of breath.   Cardiovascular: Negative for chest pain.  Gastrointestinal: Positive for abdominal pain. Negative for nausea, vomiting and diarrhea.   Genitourinary: Positive for vaginal discharge and vaginal pain. Negative for dysuria, frequency, hematuria, flank pain and vaginal bleeding.  Musculoskeletal: Negative for back pain, neck pain and neck stiffness.  Skin: Positive for rash. Negative for wound.  Neurological: Negative for dizziness, syncope, weakness and numbness.  All other systems reviewed and are negative.     Allergies  Kiwi extract  Home Medications   Prior to Admission medications   Medication Sig Start Date End Date Taking? Authorizing Provider  acyclovir (ZOVIRAX) 400 MG tablet Take 1 tablet (400 mg total) by mouth 3 (three) times daily. 07/23/14 07/28/14  Loren Raceravid Mallary Kreger, MD  HYDROcodone-homatropine [employer](HYCODAN) 5-1.5 MG/5ML syrup Take 5 mLs by mouth every 6 (six) hours as needed for cough. Patient not taking: Reported on 07/23/2014 08/15/13   Antony MaduraKelly Humes, PA-C  ibuprofen (ADVIL,MOTRIN) 600 MG tablet Take 1 tablet (600 mg total) by mouth every 6 (six) hours as needed. 07/23/14   Loren Raceravid Caelin Rosen, MD  predniSONE (DELTASONE) 20 MG tablet Take 2 tablets (40 mg total) by mouth daily. Patient not taking: Reported on 07/23/2014 08/15/13   Antony MaduraKelly Humes, PA-C  traMADol (ULTRAM) 50 MG tablet Take 1 tablet (50 mg total) by mouth every 6 (six) hours as needed. 07/23/14   Loren Raceravid Tyah Acord, MD   BP 122/70 mmHg  Pulse 78  Temp(Src) 97.2 F (36.2 C) (Oral)  Resp 18  SpO2 100%  LMP 07/19/2014 (Exact Date) Physical Exam  Constitutional: She  is oriented to person, place, and time. She appears well-developed and well-nourished. No distress.  HENT:  Head: Normocephalic and atraumatic.  Mouth/Throat: Oropharynx is clear and moist.  Eyes: EOM are normal. Pupils are equal, round, and reactive to light.  Neck: Normal range of motion. Neck supple.  Cardiovascular: Normal rate and regular rhythm.   Pulmonary/Chest: Effort normal and breath sounds normal. No respiratory distress. She has no wheezes. She has no rales. She exhibits no tenderness.   Abdominal: Soft. Bowel sounds are normal. She exhibits no distension and no mass. There is tenderness (very mild bilateral lower abdominal tenderness). There is no rebound and no guarding.  Musculoskeletal: Normal range of motion. She exhibits no edema or tenderness.  No CVA tenderness bilaterally.  Neurological: She is alert and oriented to person, place, and time.  Moves all extremities without deficit. Sensation is grossly intact.  Skin: Skin is warm and dry. No rash noted. No erythema.  Psychiatric: She has a normal mood and affect. Her behavior is normal.  Nursing note and vitals reviewed.   ED Course  Procedures (including critical care time) Labs Review Labs Reviewed  WET PREP, GENITAL - Abnormal; Notable for the following:    WBC, Wet Prep HPF POC FEW (*)    All other components within normal limits  URINALYSIS, ROUTINE W REFLEX MICROSCOPIC - Abnormal; Notable for the following:    Specific Gravity, Urine 1.031 (*)    pH 8.5 (*)    Protein, ur 100 (*)    Leukocytes, UA SMALL (*)    All other components within normal limits  URINE MICROSCOPIC-ADD ON - Abnormal; Notable for the following:    Squamous Epithelial / LPF FEW (*)    Bacteria, UA FEW (*)    All other components within normal limits  GC/CHLAMYDIA PROBE AMP  POC URINE PREG, ED    Imaging Review No results found.   EKG Interpretation None      MDM   Final diagnoses:  Herpes genitalis   Discussed results with patient. Abdominal exam is benign. Advised to have all social partners seen and treated. Return precautions given.     Loren Racer, MD 07/23/14 [phone]

## 2014-07-26 ENCOUNTER — Emergency Department (HOSPITAL_COMMUNITY)
Admission: EM | Admit: 2014-07-26 | Discharge: 2014-07-26 | Disposition: A | Discharge status: Home / Self Care | Payer: Medicaid Other | Attending: Emergency Medicine | Admitting: Emergency Medicine

## 2014-07-26 ENCOUNTER — Encounter (HOSPITAL_COMMUNITY): Payer: Self-pay | Admitting: *Deleted

## 2014-07-26 DIAGNOSIS — Z87440 Personal history of urinary (tract) infections: Secondary | ICD-10-CM | POA: Insufficient documentation

## 2014-07-26 DIAGNOSIS — Z0000 Encounter for general adult medical examination without abnormal findings: Secondary | ICD-10-CM | POA: Insufficient documentation

## 2014-07-26 DIAGNOSIS — Z79899 Other long term (current) drug therapy: Secondary | ICD-10-CM | POA: Insufficient documentation

## 2014-07-26 DIAGNOSIS — Z8619 Personal history of other infectious and parasitic diseases: Secondary | ICD-10-CM | POA: Insufficient documentation

## 2014-07-26 DIAGNOSIS — Z0189 Encounter for other specified special examinations: Secondary | ICD-10-CM

## 2014-07-26 DIAGNOSIS — Z8659 Personal history of other mental and behavioral disorders: Secondary | ICD-10-CM | POA: Insufficient documentation

## 2014-07-26 DIAGNOSIS — N898 Other specified noninflammatory disorders of vagina: Secondary | ICD-10-CM | POA: Insufficient documentation

## 2014-07-26 NOTE — ED Notes (Signed)
Pt. Refused wheelchair and left with all belongings 

## 2014-07-26 NOTE — ED Provider Notes (Signed)
CSN: [id number]     Arrival date & time 07/26/14  0304 History  This chart was scribed for Richardean Canal, MD by Bronson Curb, ED Scribe. This patient was seen in room A06C/A06C and the patient's care was started at 3:34 AM.   Chief Complaint  Patient presents with  . Labs Only    The history is provided by the patient. No language interpreter was used.     HPI Comments: Amanda Campos is a 24 y.o. female who presents to the Emergency Department for herpes test. Patient states she was seen at [facility] for a vaginal discharge and was diagnosed with herpes. She reports having only 1 partner and states his herpes test resulted negative. She reports a recurrent lesion that appears during her menstrual periods and notes it never ulcerates and resolved after her cycle. Patient does not suspects she has herpes and is requesting a blood test.She denies vomiting, fever, new rash, or changes in vaginal discharge. Patient has was given acyclovir and pain medications. She states she has not been taking any pain medication because she is not in any pain.  Past Medical History  Diagnosis Date  . UTI (lower urinary tract infection)   . Pregnancy induced hypertension   . Infection 2011    kidney infection, was treated  . Bipolar disorder   . Schizophrenic disorder    Past Surgical History  Procedure Laterality Date  . Ganglion cyst excision    . Cesarean section  07/04/2012    Procedure: CESAREAN SECTION;  Surgeon: Kathreen Cosier, MD;  Location: [facility];  Service: Obstetrics;  Laterality: N/A;  . Tonsillectomy     Family History  Problem Relation Age of Onset  . Family history unknown: Yes   History  Substance Use Topics  . Smoking status: Never Smoker   . Smokeless tobacco: Never Used  . Alcohol Use: Yes     Comment: socially   OB History    Gravida Para Term Preterm AB TAB SAB Ectopic Multiple Living       Review of Systems  Genitourinary: Positive for  vaginal discharge.  All other systems reviewed and are negative.     Allergies  Kiwi extract  Home Medications   Prior to Admission medications   Medication Sig Start Date End Date Taking? Authorizing Provider  acyclovir (ZOVIRAX) 400 MG tablet Take 1 tablet (400 mg total) by mouth 3 (three) times daily. 07/23/14 07/28/14  Loren Racer, MD  HYDROcodone-homatropine [facility]) 5-1.5 MG/5ML syrup Take 5 mLs by mouth every 6 (six) hours as needed for cough. Patient not taking: Reported on 07/23/2014 08/15/13   Antony Madura, PA-C  ibuprofen (ADVIL,MOTRIN) 600 MG tablet Take 1 tablet (600 mg total) by mouth every 6 (six) hours as needed. 07/23/14   Loren Racer, MD  predniSONE (DELTASONE) 20 MG tablet Take 2 tablets (40 mg total) by mouth daily. Patient not taking: Reported on 07/23/2014 08/15/13   Antony Madura, PA-C  traMADol (ULTRAM) 50 MG tablet Take 1 tablet (50 mg total) by mouth every 6 (six) hours as needed. 07/23/14   Loren Racer, MD   Triage Vitals: BP 121/76 mmHg  Pulse 79  Temp(Src) 98.1 F (36.7 C) (Oral)  Resp 20  SpO2 100%  LMP 07/19/2014 (Exact Date)  Physical Exam  Constitutional: She is oriented to person, place, and time. She appears well-developed and well-nourished. No distress.  HENT:  Head: Normocephalic and atraumatic.  Eyes: Conjunctivae and EOM are normal.  Neck: Neck supple. No tracheal deviation present.  Cardiovascular: Normal rate.   Pulmonary/Chest: Effort normal. No respiratory distress.  Genitourinary:  Refused pelvic.  Musculoskeletal: Normal range of motion.  Neurological: She is alert and oriented to person, place, and time.  Skin: Skin is warm and dry.  Psychiatric: She has a normal mood and affect. Her behavior is normal.  Nursing note and vitals reviewed.   ED Course  Procedures (including critical care time)  DIAGNOSTIC STUDIES: Oxygen Saturation is 100% on room air, normal by my interpretation.    COORDINATION OF CARE: At 0337 Discussed  treatment plan with patient. Patient agrees.   Labs Review Labs Reviewed  WET PREP, GENITAL  GC/CHLAMYDIA PROBE AMP    Imaging Review No results found.   EKG Interpretation None      MDM   Final diagnoses:  None   Amanda Campos is a 24 y.o. female here requesting blood test for herpes. She is already on acyclovir. She refused pelvic since it was done 3 days ago and the lesion resolved. I told her that herpes test is a send out and won't come back today. She will be called for positive results. Stable for d/c    I personally performed the services described in this documentation, which was scribed in my presence. The recorded information has been reviewed and is accurate.   Richardean Canalavid H Tyronne Blann, MD 07/26/14 [phone]

## 2014-07-26 NOTE — Discharge Instructions (Signed)
You will be called for positive blood test.   Follow up with your doctor.   Return to ER if you have worse discharge, lesions around your vagina, fevers.

## 2014-07-26 NOTE — ED Notes (Signed)
Pt. Thought she had a yeast infection and went and saw her PCP. Her PCP told her she had herpes. Pt. Sig. Other got tested for herpes and it came back negative. Pt. Wants a repeat of her herpes test.

## 2014-07-28 LAB — HSV 2 ANTIBODY, IGG: HSV 2 Glycoprotein G Ab, IgG: 10.58 IV — ABNORMAL HIGH

## 2014-07-29 LAB — HSV(HERPES SIMPLEX VRS) I + II AB-IGM: Herpes Simplex Vrs I&II-IgM Ab (EIA): 2.26 INDEX — ABNORMAL HIGH

## 2015-03-14 ENCOUNTER — Emergency Department (HOSPITAL_COMMUNITY)
Admission: EM | Admit: 2015-03-14 | Discharge: 2015-03-14 | Disposition: A | Discharge status: Home / Self Care | Payer: Medicaid Other | Attending: Emergency Medicine | Admitting: Emergency Medicine

## 2015-03-14 ENCOUNTER — Encounter (HOSPITAL_COMMUNITY): Payer: Self-pay | Admitting: Emergency Medicine

## 2015-03-14 DIAGNOSIS — Z3202 Encounter for pregnancy test, result negative: Secondary | ICD-10-CM | POA: Insufficient documentation

## 2015-03-14 DIAGNOSIS — Z8659 Personal history of other mental and behavioral disorders: Secondary | ICD-10-CM | POA: Insufficient documentation

## 2015-03-14 DIAGNOSIS — N390 Urinary tract infection, site not specified: Secondary | ICD-10-CM | POA: Insufficient documentation

## 2015-03-14 DIAGNOSIS — L259 Unspecified contact dermatitis, unspecified cause: Secondary | ICD-10-CM

## 2015-03-14 LAB — URINALYSIS, ROUTINE W REFLEX MICROSCOPIC
Glucose, UA: NEGATIVE mg/dL
HGB URINE DIPSTICK: NEGATIVE
Ketones, ur: 15 mg/dL — AB
Nitrite: POSITIVE — AB
PROTEIN: 30 mg/dL — AB
Specific Gravity, Urine: 1.031 — ABNORMAL HIGH (ref 1.005–1.030)
UROBILINOGEN UA: 1 mg/dL (ref 0.0–1.0)
pH: 6.5 (ref 5.0–8.0)

## 2015-03-14 LAB — URINE MICROSCOPIC-ADD ON

## 2015-03-14 LAB — PREGNANCY, URINE: Preg Test, Ur: NEGATIVE

## 2015-03-14 MED ORDER — CEPHALEXIN 500 MG PO CAPS: 500.0000 mg | ORAL_CAPSULE | Freq: Two times a day (BID) | ORAL | Status: DC

## 2015-03-14 MED ORDER — PHENAZOPYRIDINE HCL 200 MG PO TABS: 200.0000 mg | ORAL_TABLET | Freq: Three times a day (TID) | ORAL | Status: DC

## 2015-03-14 MED ORDER — HYDROCORTISONE 2.5 % EX LOTN: TOPICAL_LOTION | Freq: Two times a day (BID) | CUTANEOUS | Status: DC

## 2015-03-14 NOTE — ED Notes (Signed)
Pt verbalized undertanding of d/c instructions and has no further questions.  

## 2015-03-14 NOTE — ED Notes (Signed)
Pt. reports dysuria with cloudy urine onset this week , denies fever or chills, pt. added itchy skin rashes at neck onset yesterday .

## 2015-03-14 NOTE — ED Provider Notes (Signed)
CSN: [id number]     Arrival date & time 03/14/15  2200 History   First MD Initiated Contact with Patient 03/14/15 2215     Chief Complaint  Patient presents with  . Urinary Tract Infection     (Consider location/radiation/quality/duration/timing/severity/associated sxs/prior Treatment) Patient is a 24 y.o. female presenting with urinary tract infection. The history is provided by the patient. No language interpreter was used.  Urinary Tract Infection Pain quality:  Burning Pain severity:  Mild Onset quality:  Sudden Duration:  3 days Timing:  Intermittent Progression:  Waxing and waning Chronicity:  New Recent urinary tract infections: no   Relieved by: Frequency improved after taking AZO tabs. Urinary symptoms: discolored urine and frequent urination   Urinary symptoms: no hematuria and no bladder incontinence   Associated symptoms: vomiting (x1 PTA only)   Associated symptoms: no abdominal pain, no fever, no flank pain and no nausea   Risk factors: sexually active   Risk factors: no hx of pyelonephritis, no hx of urolithiasis and no recurrent urinary tract infections     Past Medical History  Diagnosis Date  . UTI (lower urinary tract infection)   . Pregnancy induced hypertension   . Infection 2011    kidney infection, was treated  . Bipolar disorder   . Schizophrenic disorder    Past Surgical History  Procedure Laterality Date  . Ganglion cyst excision    . Cesarean section  07/04/2012    Procedure: CESAREAN SECTION;  Surgeon: Kathreen Cosier, MD;  Location: [facility];  Service: Obstetrics;  Laterality: N/A;  . Tonsillectomy     Family History  Problem Relation Age of Onset  . Family history unknown: Yes   Social History  Substance Use Topics  . Smoking status: Never Smoker   . Smokeless tobacco: Never Used  . Alcohol Use: Yes     Comment: socially   OB History    Gravida Para Term Preterm AB TAB SAB Ectopic Multiple Living   1 1 0 1 0 0 0 0 0 1        Review of Systems  Constitutional: Negative for fever.  Gastrointestinal: Positive for vomiting (x1 PTA only). Negative for nausea and abdominal pain.  Genitourinary: Positive for dysuria, urgency and frequency. Negative for hematuria and flank pain.  All other systems reviewed and are negative.   Allergies  Kiwi extract  Home Medications   Prior to Admission medications   Medication Sig Start Date End Date Taking? Authorizing Provider  cephALEXin (KEFLEX) 500 MG capsule Take 1 capsule (500 mg total) by mouth 2 (two) times daily. 03/14/15   Antony Madura, PA-C  HYDROcodone-homatropine (HYCODAN) 5-1.5 MG/5ML syrup Take 5 mLs by mouth every 6 (six) hours as needed for cough. Patient not taking: Reported on 07/23/2014 08/15/13   Antony Madura, PA-C  hydrocortisone 2.5 % lotion Apply topically 2 (two) times daily. Do not apply to face 03/14/15   Antony Madura, PA-C  ibuprofen (ADVIL,MOTRIN) 600 MG tablet Take 1 tablet (600 mg total) by mouth every 6 (six) hours as needed. 07/23/14   Loren Racer, MD  phenazopyridine (PYRIDIUM) 200 MG tablet Take 1 tablet (200 mg total) by mouth 3 (three) times daily. 03/14/15   Antony Madura, PA-C  predniSONE (DELTASONE) 20 MG tablet Take 2 tablets (40 mg total) by mouth daily. Patient not taking: Reported on 07/23/2014 08/15/13   Antony Madura, PA-C  traMADol (ULTRAM) 50 MG tablet Take 1 tablet (50 mg total) by mouth every 6 (six) hours as  needed. 07/23/14   Loren Racer, MD   Pulse 89  Temp(Src) 98.9 F (37.2 C) (Oral)  Resp 14  Ht  (1.6 m)  Wt 183 lb (83.008 kg)  BMI 32.43 kg/m2  SpO2 99%  LMP 03/07/2015   Physical Exam  Constitutional: She is oriented to person, place, and time. She appears well-developed and well-nourished. No distress.  Nontoxic/nonseptic appearing  HENT:  Head: Normocephalic and atraumatic.  Eyes: Conjunctivae and EOM are normal. No scleral icterus.  Neck: Normal range of motion.  Cardiovascular: Normal rate, regular rhythm and  intact distal pulses.   Pulmonary/Chest: Effort normal. No respiratory distress.  Respirations even and unlabored  Abdominal: Soft. She exhibits no distension. There is no tenderness. There is no rebound.  Soft, nontender abdomen. No masses or peritoneal signs  Musculoskeletal: Normal range of motion.  Neurological: She is alert and oriented to person, place, and time. She exhibits normal muscle tone. Coordination normal.  Skin: Skin is warm and dry. Rash noted. She is not diaphoretic. No erythema. No pallor.  Maculopapular rash noted at base of neck on the right. No weeping, induration, purulence, or heat to touch. No skin sloughing.  Psychiatric: She has a normal mood and affect. Her behavior is normal.  Nursing note and vitals reviewed.   ED Course  Procedures (including critical care time) Labs Review Labs Reviewed  URINALYSIS, ROUTINE W REFLEX MICROSCOPIC (NOT AT [facility]) - Abnormal; Notable for the following:    Color, Urine ORANGE (*)    APPearance CLOUDY (*)    Specific Gravity, Urine 1.031 (*)    Bilirubin Urine SMALL (*)    Ketones, ur 15 (*)    Protein, ur 30 (*)    Nitrite POSITIVE (*)    Leukocytes, UA MODERATE (*)    All other components within normal limits  URINE MICROSCOPIC-ADD ON - Abnormal; Notable for the following:    Bacteria, UA MANY (*)    All other components within normal limits  URINE CULTURE  PREGNANCY, URINE    Imaging Review No results found.   I have personally reviewed and evaluated these images and lab results as part of my medical decision-making.   EKG Interpretation None      MDM   Final diagnoses:  UTI (lower urinary tract infection)  Contact dermatitis    Pt has been diagnosed with a UTI. Physical exam findings also consistent with contact dermatitis to the base of patient's neck on the right. Pt is afebrile, no CVA tenderness, normotensive. Pt to be discharged home with antibiotics and instructions to follow up with PCP if  symptoms persist. Return precautions given. Patient discharged in good condition with no unaddressed concerns.   Filed Vitals:   03/14/15 2206  Pulse: 89  Temp: 98.9 F (37.2 C)  TempSrc: Oral  Resp: 14  Height:  (1.6 m)  Weight: 183 lb (83.008 kg)  SpO2: 99%      Antony Madura, PA-C 03/14/15 2325  Laurence Spates, MD 03/15/15 [phone]

## 2015-03-14 NOTE — Discharge Instructions (Signed)

## 2015-03-14 NOTE — ED Notes (Signed)
Taking AZO with no relief.

## 2015-03-17 LAB — URINE CULTURE

## 2015-03-18 ENCOUNTER — Telehealth (HOSPITAL_BASED_OUTPATIENT_CLINIC_OR_DEPARTMENT_OTHER): Payer: Self-pay | Admitting: Emergency Medicine

## 2015-03-18 NOTE — Telephone Encounter (Signed)
Post ED Visit - Positive Culture Follow-up  Culture report reviewed by antimicrobial stewardship pharmacist:  Wes Dulaney, Pharm.D., BCPS  Celedonio Miyamoto, Pharm.D., BCPS  Georgina Pillion, Pharm.D., BCPS  [place], [place].D., BCPS, AAHIVP  Estella Husk, Pharm.D., BCPS, AAHIVP  Elder Cyphers, [address].D., BCPS Casilda Carls PharmD  Positive urine culture E. coli Treated with cephalexin, organism sensitive to the same and no further patient follow-up is required at this time.  Berle Mull 03/18/2015, 9:50 AM

## 2016-01-23 ENCOUNTER — Encounter (HOSPITAL_BASED_OUTPATIENT_CLINIC_OR_DEPARTMENT_OTHER): Payer: Self-pay | Admitting: *Deleted

## 2016-01-23 ENCOUNTER — Emergency Department (HOSPITAL_BASED_OUTPATIENT_CLINIC_OR_DEPARTMENT_OTHER)
Admission: EM | Admit: 2016-01-23 | Discharge: 2016-01-23 | Disposition: A | Discharge status: Home / Self Care | Payer: Medicaid Other | Attending: Emergency Medicine | Admitting: Emergency Medicine

## 2016-01-23 DIAGNOSIS — F319 Bipolar disorder, unspecified: Secondary | ICD-10-CM | POA: Insufficient documentation

## 2016-01-23 DIAGNOSIS — F209 Schizophrenia, unspecified: Secondary | ICD-10-CM | POA: Insufficient documentation

## 2016-01-23 DIAGNOSIS — R112 Nausea with vomiting, unspecified: Secondary | ICD-10-CM

## 2016-01-23 LAB — URINALYSIS, ROUTINE W REFLEX MICROSCOPIC
GLUCOSE, UA: NEGATIVE mg/dL
HGB URINE DIPSTICK: NEGATIVE
Ketones, ur: NEGATIVE mg/dL
Nitrite: NEGATIVE
PH: 6.5 (ref 5.0–8.0)
Protein, ur: NEGATIVE mg/dL
SPECIFIC GRAVITY, URINE: 1.028 (ref 1.005–1.030)

## 2016-01-23 LAB — PREGNANCY, URINE: Preg Test, Ur: NEGATIVE

## 2016-01-23 LAB — URINE MICROSCOPIC-ADD ON: RBC / HPF: NONE SEEN RBC/hpf (ref 0–5)

## 2016-01-23 MED ORDER — ONDANSETRON HCL 4 MG/2ML IJ SOLN
4.0000 mg | Freq: Once | INTRAMUSCULAR | Status: AC
  Administered 2016-01-23: 4 mg via INTRAVENOUS
  Filled 2016-01-23: qty 2

## 2016-01-23 MED ORDER — SODIUM CHLORIDE 0.9 % IV BOLUS (SEPSIS)
1000.0000 mL | Freq: Once | INTRAVENOUS | Status: AC
  Administered 2016-01-23: 1000 mL via INTRAVENOUS

## 2016-01-23 MED ORDER — ONDANSETRON 4 MG PO TBDP: 4.0000 mg | ORAL_TABLET | Freq: Three times a day (TID) | ORAL | Status: DC | PRN

## 2016-01-23 NOTE — ED Notes (Signed)
Pt given d/c instructions as per chart. Rx x 1. Verbalizes understanding. No questions. 

## 2016-01-23 NOTE — ED Notes (Signed)
Pt c/o nausea onset 0900. Vomiting all day. Unable to keep POs down. Some abd pain.

## 2016-01-23 NOTE — ED Notes (Signed)
Provider at bedside

## 2016-01-23 NOTE — ED Provider Notes (Signed)
CSN: [id number]     Arrival date & time 01/23/16  1853 History  By signing my name below, I, Evon Slackerrance Branch, attest that this documentation has been prepared under the direction and in the presence of Sealed Air CorporationHeather Ryiah Bellissimo, PA-C. Electronically Signed: Evon Slackerrance Branch, ED Scribe. 01/23/2016. 7:34 PM.     Chief Complaint  Patient presents with  . Abdominal Pain   The history is provided by the patient. No language interpreter was used.   HPI Comments: Amanda Campos is a 25 y.o. female who presents to the Emergency Department complaining of nausea and vomiting onset today at 9 AM. She reports several episodes of vomiting.  Pt also reports slight diffuse abdominal pain. She reports small amount blood streaking in her last episode of vomiting. Pt states he is not able to tolerate fluids or solids. Denies coming in contact with anyone with similar symptoms. She states she has tried Weyerhaeuser CompanyPepto Bismol with no relief. Denies fever diarrhea or other related symptoms.    Past Medical History  Diagnosis Date  . UTI (lower urinary tract infection)   . Pregnancy induced hypertension   . Infection 2011    kidney infection, was treated  . Bipolar disorder (HCC)   . Schizophrenic disorder [facility](HCC)    Past Surgical History  Procedure Laterality Date  . Ganglion cyst excision    . Cesarean section  07/04/2012    Procedure: CESAREAN SECTION;  Surgeon: Kathreen CosierBernard A Marshall, MD;  Location: [facility];  Service: Obstetrics;  Laterality: N/A;  . Tonsillectomy     Family History  Problem Relation Age of Onset  . Family history unknown: Yes   Social History  Substance Use Topics  . Smoking status: Never Smoker   . Smokeless tobacco: Never Used  . Alcohol Use: Yes     Comment: socially   OB History    Gravida Para Term Preterm AB TAB SAB Ectopic Multiple Living   1 1 0 1 0 0 0 0 0 1      Review of Systems  Gastrointestinal: Positive for nausea and vomiting.   A complete 10 system review of systems was obtained and  all systems are negative except as noted in the HPI and PMH.     Allergies  Kiwi extract  Home Medications   Prior to Admission medications   Medication Sig Start Date End Date Taking? Authorizing Provider  cephALEXin (KEFLEX) 500 MG capsule Take 1 capsule (500 mg total) by mouth 2 (two) times daily. 03/14/15   Antony MaduraKelly Humes, PA-C  HYDROcodone-homatropine (HYCODAN) 5-1.5 MG/5ML syrup Take 5 mLs by mouth every 6 (six) hours as needed for cough. Patient not taking: Reported on 07/23/2014 08/15/13   Antony MaduraKelly Humes, PA-C  hydrocortisone 2.5 % lotion Apply topically 2 (two) times daily. Do not apply to face 03/14/15   Antony MaduraKelly Humes, PA-C  ibuprofen (ADVIL,MOTRIN) 600 MG tablet Take 1 tablet (600 mg total) by mouth every 6 (six) hours as needed. 07/23/14   Loren Raceravid [place], MD  phenazopyridine (PYRIDIUM) 200 MG tablet Take 1 tablet (200 mg total) by mouth 3 (three) times daily. 03/14/15   Antony MaduraKelly Humes, PA-C  predniSONE (DELTASONE) 20 MG tablet Take 2 tablets (40 mg total) by mouth daily. Patient not taking: Reported on 07/23/2014 08/15/13   Antony MaduraKelly Humes, PA-C  traMADol (ULTRAM) 50 MG tablet Take 1 tablet (50 mg total) by mouth every 6 (six) hours as needed. 07/23/14   Loren Raceravid [place]el[place], MD   BP 122/83 mmHg  Pulse 92  Temp(Src) 98.6 F (37  C) (Oral)  Resp 18  Ht  (1.626 m)  Wt 200 lb (90.719 kg)  BMI 34.31 kg/m2  SpO2 100%  LMP 12/31/2015   Physical Exam  Constitutional: She is oriented to person, place, and time. She appears well-developed and well-nourished. No distress.  HENT:  Head: Normocephalic and atraumatic.  Mouth/Throat: Oropharynx is clear and moist.  Eyes: Conjunctivae and EOM are normal.  Neck: Neck supple. No tracheal deviation present.  Cardiovascular: Normal rate, regular rhythm and normal heart sounds.   Pulmonary/Chest: Effort normal and breath sounds normal. No respiratory distress. She has no wheezes. She has no rales.  Abdominal: Soft. Bowel sounds are normal. There is no  tenderness. There is no rebound and no guarding.  Musculoskeletal: Normal range of motion.  Neurological: She is alert and oriented to person, place, and time.  Skin: Skin is warm and dry.  Psychiatric: She has a normal mood and affect. Her behavior is normal.  Nursing note and vitals reviewed.   ED Course  Procedures (including critical care time) DIAGNOSTIC STUDIES: Oxygen Saturation is 100% on RA, normal by my interpretation.    COORDINATION OF CARE: 7:34 PM-Discussed treatment plan which includes IV fluids and nausea control with pt at bedside and pt agreed to plan.     Labs Review Labs Reviewed  PREGNANCY, URINE  URINALYSIS, ROUTINE W REFLEX MICROSCOPIC (NOT AT [facility])    Imaging Review No results found. I have personally reviewed and evaluated these lab results as part of my medical decision-making.   EKG Interpretation None     8:42 PM Reassessed patient.  Nausea has improved at this time.  Will fluid challenge and reassess.  9:24 PM Reassessed patient.  Tolerating PO liquids.  Abdomen soft and non tender on exam. MDM   Final diagnoses:  None  Patient presents today with nausea, vomiting, and mild abdominal pain onset this morning.  Abdomen is soft and non tender on exam.  Therefore, doubt surgical abdomen.  Do not feel that imaging is indicated.  Nausea improved in the ED.  No episodes of vomiting in the ED.  Patient tolerating PO liquids.  Stable for discharge.  Given Rx for Zofran.  Return precautions given.  I personally performed the services described in this documentation, which was scribed in my presence. The recorded information has been reviewed and is accurate.      Santiago Glad, PA-C 01/25/16 3086  Doug Sou, MD 01/25/16 [phone]

## 2016-04-12 ENCOUNTER — Encounter: Payer: Self-pay | Admitting: *Deleted

## 2018-03-27 ENCOUNTER — Inpatient Hospital Stay (HOSPITAL_COMMUNITY)
Admission: AD | Admit: 2018-03-27 | Discharge: 2018-03-27 | Disposition: A | Discharge status: Home / Self Care | Payer: Managed Care, Other (non HMO) | Source: Ambulatory Visit | Attending: Obstetrics & Gynecology | Admitting: Obstetrics & Gynecology

## 2018-03-27 ENCOUNTER — Encounter (HOSPITAL_COMMUNITY): Payer: Self-pay | Admitting: *Deleted

## 2018-03-27 ENCOUNTER — Inpatient Hospital Stay (HOSPITAL_COMMUNITY): Payer: Managed Care, Other (non HMO)

## 2018-03-27 DIAGNOSIS — R102 Pelvic and perineal pain unspecified side: Secondary | ICD-10-CM

## 2018-03-27 DIAGNOSIS — O23591 Infection of other part of genital tract in pregnancy, first trimester: Secondary | ICD-10-CM | POA: Diagnosis not present

## 2018-03-27 DIAGNOSIS — A599 Trichomoniasis, unspecified: Secondary | ICD-10-CM

## 2018-03-27 DIAGNOSIS — Z3A01 Less than 8 weeks gestation of pregnancy: Secondary | ICD-10-CM | POA: Insufficient documentation

## 2018-03-27 DIAGNOSIS — O26891 Other specified pregnancy related conditions, first trimester: Secondary | ICD-10-CM | POA: Diagnosis not present

## 2018-03-27 DIAGNOSIS — O283 Abnormal ultrasonic finding on antenatal screening of mother: Secondary | ICD-10-CM | POA: Diagnosis not present

## 2018-03-27 DIAGNOSIS — O3680X0 Pregnancy with inconclusive fetal viability, not applicable or unspecified: Secondary | ICD-10-CM

## 2018-03-27 LAB — CBC
HEMATOCRIT: 36 % (ref 36.0–46.0)
HEMOGLOBIN: 11.5 g/dL — AB (ref 12.0–15.0)
MCH: 25.4 pg — ABNORMAL LOW (ref 26.0–34.0)
MCHC: 31.9 g/dL (ref 30.0–36.0)
MCV: 79.6 fL (ref 78.0–100.0)
Platelets: 312 10*3/uL (ref 150–400)
RBC: 4.52 MIL/uL (ref 3.87–5.11)
RDW: 15.5 % (ref 11.5–15.5)
WBC: 6.8 10*3/uL (ref 4.0–10.5)

## 2018-03-27 LAB — URINALYSIS, ROUTINE W REFLEX MICROSCOPIC
Bilirubin Urine: NEGATIVE
GLUCOSE, UA: NEGATIVE mg/dL
HGB URINE DIPSTICK: NEGATIVE
KETONES UR: NEGATIVE mg/dL
Leukocytes, UA: NEGATIVE
Nitrite: NEGATIVE
PROTEIN: NEGATIVE mg/dL
Specific Gravity, Urine: 1.023 (ref 1.005–1.030)
pH: 7 (ref 5.0–8.0)

## 2018-03-27 LAB — WET PREP, GENITAL
Clue Cells Wet Prep HPF POC: NONE SEEN
WBC, Wet Prep HPF POC: NONE SEEN
Yeast Wet Prep HPF POC: NONE SEEN

## 2018-03-27 LAB — HCG, QUANTITATIVE, PREGNANCY: hCG, Beta Chain, Quant, S: 1181 m[IU]/mL — ABNORMAL HIGH (ref <5)

## 2018-03-27 LAB — POCT PREGNANCY, URINE: Preg Test, Ur: POSITIVE — AB

## 2018-03-27 MED ORDER — METRONIDAZOLE 500 MG PO TABS
2000.0000 mg | ORAL_TABLET | Freq: Once | ORAL | Status: AC
  Administered 2018-03-27: 2000 mg via ORAL
  Filled 2018-03-27: qty 4

## 2018-03-27 NOTE — MAU Note (Signed)
Pt C/O lower abdominal cramping x 1 week, worse when she sneezes.  Denies bleeding or discharge.  Also C/O nausea for the last 2 weeks.  Pos HPT last week, faint line.

## 2018-03-27 NOTE — Discharge Instructions (Signed)
Ectopic Pregnancy An ectopic pregnancy is when the fertilized egg attaches (implants) outside the uterus. Most ectopic pregnancies occur in one of the tubes where eggs travel from the ovary to the uterus (fallopian tubes), but the implanting can occur in other locations. In rare cases, ectopic pregnancies occur on the ovary, intestine, pelvis, abdomen, or cervix. In an ectopic pregnancy, the fertilized egg does not have the ability to develop into a normal, healthy baby. A ruptured ectopic pregnancy is one in which tearing or bursting of a fallopian tube causes internal bleeding. Often, there is intense lower abdominal pain, and vaginal bleeding sometimes occurs. Having an ectopic pregnancy can be life-threatening. If this dangerous condition is not treated, it can lead to blood loss, shock, or even death. What are the causes? The most common cause of this condition is damage to one of the fallopian tubes. A fallopian tube may be narrowed or blocked, and that keeps the fertilized egg from reaching the uterus. What increases the risk? This condition is more likely to develop in women of childbearing age who have different levels of risk. The levels of risk can be divided into three categories. High risk  You have gone through infertility treatment.  You have had an ectopic pregnancy before.  You have had surgery on the fallopian tubes, or another surgical procedure, such as an abortion.  You have had surgery to have the fallopian tubes tied (tubal ligation).  You have problems or diseases of the fallopian tubes.  You have been exposed to diethylstilbestrol (DES). This medicine was used until 1971, and it had effects on babies whose mothers took the medicine.  You become pregnant while using an IUD (intrauterine device) for birth control. Moderate risk  You have a history of infertility.  You have had an STI (sexually transmitted infection).  You have a history of pelvic inflammatory  disease (PID).  You have scarring from endometriosis.  You have multiple sexual partners.  You smoke. Low risk  You have had pelvic surgery.  You use vaginal douches.  You became sexually active before age 33. What are the signs or symptoms? Common symptoms of this condition include normal pregnancy symptoms, such as missing a period, nausea, tiredness, abdominal pain, breast tenderness, and bleeding. However, ectopic pregnancy will have additional symptoms, such as:  Pain with intercourse.  Irregular vaginal bleeding or spotting.  Cramping or pain on one side or in the lower abdomen.  Fast heartbeat, low blood pressure, and sweating.  Passing out while having a bowel movement.  Symptoms of a ruptured ectopic pregnancy and internal bleeding may include:  Sudden, severe pain in the abdomen and pelvis.  Dizziness, weakness, light-headedness, or fainting.  Pain in the shoulder or neck area.  How is this diagnosed? This condition is diagnosed by:  A pelvic exam to locate pain or a mass in the abdomen.  A pregnancy test. This blood test checks for the presence as well as the specific level of pregnancy hormone in the bloodstream.  Ultrasound. This is performed if a pregnancy test is positive. In this test, a probe is inserted into the vagina. The probe will detect a fetus, possibly in a location other than the uterus.  Taking a sample of uterus tissue (dilation and curettage, or D&C).  Surgery to perform a visual exam of the inside of the abdomen using a thin, lighted tube that has a tiny camera on the end (laparoscope).  Culdocentesis. This procedure involves inserting a needle at the top  of the vagina, behind the uterus. If blood is present in this area, it may indicate that a fallopian tube is torn.  How is this treated? This condition is treated with medicine or surgery. Medicine  An injection of a medicine (methotrexate) may be given to cause the pregnancy tissue  to be absorbed. This medicine may save your fallopian tube. It may be given if: ? The diagnosis is made early, with no signs of active bleeding. ? The fallopian tube has not ruptured. ? You are considered to be a good candidate for the medicine. Usually, pregnancy hormone blood levels are checked after methotrexate treatment. This is to be sure that the medicine is effective. It may take 4-6 weeks for the pregnancy to be absorbed. Most pregnancies will be absorbed by 3 weeks. Surgery  A laparoscope may be used to remove the pregnancy tissue.  If severe internal bleeding occurs, a larger cut (incision) may be made in the lower abdomen (laparotomy) to remove the fetus and placenta. This is done to stop the bleeding.  Part or all of the fallopian tube may be removed (salpingectomy) along with the fetus and placenta. The fallopian tube may also be repaired during the surgery.  In very rare circumstances, removal of the uterus (hysterectomy) may be required.  After surgery, pregnancy hormone testing may be done to be sure that there is no pregnancy tissue left. Whether your treatment is medicine or surgery, you may receive a Rho (D) immune globulin shot to prevent problems with any future pregnancy. This shot may be given if:  You are Rh-negative and the baby's father is Rh-positive.  You are Rh-negative and you do not know the Rh type of the baby's father.  Follow these instructions at home:  Rest and limit your activity after the procedure for as long as told by your health care provider.  Until your health care provider says that it is safe: ? Do not lift anything that is heavier than 10 lb (4.5 kg), or the limit that your health care provider tells you. ? Avoid physical exercise and any movement that requires effort (is strenuous).  To help prevent constipation: ? Eat a healthy diet that includes fruits, vegetables, and whole grains. ? Drink 6-8 glasses of water per day. Get help  right away if:  You develop worsening pain that is not relieved by medicine.  You have: ? A fever or chills. ? Vaginal bleeding. ? Redness and swelling at the incision site. ? Nausea and vomiting.  You feel dizzy or weak.  You feel light-headed or you faint. This information is not intended to replace advice given to you by your health care provider. Make sure you discuss any questions you have with your health care provider. Document Released: 08/11/2004 Document Revised: 03/02/2016 Document Reviewed: 02/03/2016 Elsevier Interactive Patient Education  2018 [employer].   Trichomoniasis Trichomoniasis is an STI (sexually transmitted infection) that can affect both women and men. In women, the outer area of the female genitalia (vulva) and the vagina are affected. In men, the penis is mainly affected, but the prostate and other reproductive organs can also be involved. This condition can be treated with medicine. It often has no symptoms (is asymptomatic), especially in men. What are the causes? This condition is caused by an organism called Trichomonas vaginalis. Trichomoniasis most often spreads from person to person (is contagious) through sexual contact. What increases the risk? The following factors may make you more likely to develop this condition:  Having unprotected sexual intercourse.  Having sexual intercourse with a partner who has trichomoniasis.  Having multiple sexual partners.  Having had previous trichomoniasis infections or other STIs.  What are the signs or symptoms? In women, symptoms of trichomoniasis include:  Abnormal vaginal discharge that is clear, white, gray, or yellow-green and foamy and has an unusual "fishy" odor.  Itching and irritation of the vagina and vulva.  Burning or pain during urination or sexual intercourse.  Genital redness and swelling.  In men, symptoms of trichomoniasis include:  Penile discharge that may be foamy or contain  pus.  Pain in the penis. This may happen only when urinating.  Itching or irritation inside the penis.  Burning after urination or ejaculation.  How is this diagnosed? In women, this condition may be found during a routine Pap test or physical exam. It may be found in men during a routine physical exam. Your health care provider may perform tests to help diagnose this infection, such as:  Urine tests (men and women).  The following in women: ? Testing the pH of the vagina. ? A vaginal swab test that checks for the Trichomonas vaginalis organism. ? Testing vaginal secretions.  Your health care provider may test you for other STIs, including HIV (human immunodeficiency virus). How is this treated? This condition is treated with medicine taken by mouth (orally), such as metronidazole or tinidazole to fight the infection. Your sexual partner(s) may also need to be tested and treated.  If you are a woman and you plan to become pregnant or think you may be pregnant, tell your health care provider right away. Some medicines that are used to treat the infection should not be taken during pregnancy.  Your health care provider may recommend over-the-counter medicines or creams to help relieve itching or irritation. You may be tested for infection again 3 months after treatment. Follow these instructions at home:  Take and use over-the-counter and prescription medicines, including creams, only as told by your health care provider.  Do not have sexual intercourse until one week after you finish your medicine, or until your health care provider approves. Ask your health care provider when you may resume sexual intercourse.  (Women) Do not douche or wear tampons while you have the infection.  Discuss your infection with your sexual partner(s). Make sure that your partner gets tested and treated, if necessary.  Keep all follow-up visits as told by your health care provider. This is  important. How is this prevented?  Use condoms every time you have sex. Using condoms correctly and consistently can help protect against STIs.  Avoid having multiple sexual partners.  Talk with your sexual partner about any symptoms that either of you may have, as well as any history of STIs.  Get tested for STIs and STDs (sexually transmitted diseases) before you have sex. Ask your partner to do the same.  Do not have sexual contact if you have symptoms of trichomoniasis or another STI. Contact a health care provider if:  You still have symptoms after you finish your medicine.  You develop pain in your abdomen.  You have pain when you urinate.  You have bleeding after sexual intercourse.  You develop a rash.  You feel nauseous or you vomit.  You plan to become pregnant or think you may be pregnant. Summary  Trichomoniasis is an STI (sexually transmitted infection) that can affect both women and men.  This condition often has no symptoms (is asymptomatic), especially in men.  You should not have sexual intercourse until one week after you finish your medicine, or until your health care provider approves. Ask your health care provider when you may resume sexual intercourse.  Discuss your infection with your sexual partner. Make sure that your partner gets tested and treated, if necessary. This information is not intended to replace advice given to you by your health care provider. Make sure you discuss any questions you have with your health care provider. Document Released: 12/28/2000 Document Revised: 05/27/2016 Document Reviewed: 05/27/2016 Elsevier Interactive Patient Education  2017 [employer].

## 2018-03-27 NOTE — MAU Provider Note (Signed)
History     CSN: [id number]  Arrival date and time: 03/27/18 6789   First Provider Initiated Contact with Patient 03/27/18 1038      Chief Complaint  Patient presents with  . Pelvic Pain  . Nausea   Cathrin Ermel is a 27 y.o. G2P0101 at [redacted]w[redacted]d by approximate LMP. She is here today with nausea and cramping. Denies any bleeding or discharge.   Pelvic Pain  The patient's primary symptoms include pelvic pain. The patient's pertinent negatives include no vaginal discharge. This is a new problem. Episode onset: 2 weeks  The problem occurs intermittently. The problem has been unchanged. Pain severity now: 7/10 when it occurs. The problem affects both sides. She is pregnant. Associated symptoms include nausea and vomiting (about once every few days ). Pertinent negatives include no chills, dysuria, fever or frequency. The vaginal discharge was normal. There has been no bleeding. Exacerbated by: sneezing makes cramping worse  She has tried nothing for the symptoms. She uses nothing for contraception. Her menstrual history has been irregular (LMP 12/17/17).    OB History    Gravida  2   Para  1   Term  0   Preterm  1   AB  0   Living  1     SAB  0   TAB  0   Ectopic  0   Multiple  0   Live Births  1           Past Medical History:  Diagnosis Date  . Bipolar disorder (HCC)   . Infection 2011   kidney infection, was treated  . Pregnancy induced hypertension   . Schizophrenic disorder (HCC)   . UTI (lower urinary tract infection)     Past Surgical History:  Procedure Laterality Date  . CESAREAN SECTION  07/04/2012   Procedure: CESAREAN SECTION;  Surgeon: Kathreen Cosier, MD;  Location: [facility];  Service: Obstetrics;  Laterality: N/A;  . GANGLION CYST EXCISION    . TONSILLECTOMY      Family History  Family history unknown: Yes    Social History   Tobacco Use  . Smoking status: Never Smoker  . Smokeless tobacco: Never Used  Substance Use Topics  . Alcohol  use: Yes    Comment: socially  . Drug use: No    Allergies:  Allergies  Allergen Reactions  . Kiwi Extract Itching    Medications Prior to Admission  Medication Sig Dispense Refill Last Dose  . cephALEXin (KEFLEX) 500 MG capsule Take 1 capsule (500 mg total) by mouth 2 (two) times daily. 14 capsule 0   . HYDROcodone-homatropine (HYCODAN) 5-1.5 MG/5ML syrup Take 5 mLs by mouth every 6 (six) hours as needed for cough. (Patient not taking: Reported on 07/23/2014) 120 mL 0 Completed Course at Unknown time  . hydrocortisone 2.5 % lotion Apply topically 2 (two) times daily. Do not apply to face 59 mL 0   . ibuprofen (ADVIL,MOTRIN) 600 MG tablet Take 1 tablet (600 mg total) by mouth every 6 (six) hours as needed. 30 tablet 0   . ondansetron (ZOFRAN ODT) 4 MG disintegrating tablet Take 1 tablet (4 mg total) by mouth every 8 (eight) hours as needed for nausea or vomiting. 20 tablet 0   . phenazopyridine (PYRIDIUM) 200 MG tablet Take 1 tablet (200 mg total) by mouth 3 (three) times daily. 6 tablet 0   . predniSONE (DELTASONE) 20 MG tablet Take 2 tablets (40 mg total) by mouth daily. (Patient not  taking: Reported on 07/23/2014) 10 tablet 0 Completed Course at Unknown time  . traMADol (ULTRAM) 50 MG tablet Take 1 tablet (50 mg total) by mouth every 6 (six) hours as needed. 15 tablet 0     Review of Systems  Constitutional: Negative for chills and fever.  Gastrointestinal: Positive for nausea and vomiting (about once every few days ).  Genitourinary: Positive for pelvic pain. Negative for dysuria, frequency, vaginal bleeding and vaginal discharge.   Physical Exam   Blood pressure 123/74, pulse 81, temperature 98 F (36.7 C), temperature source Oral, resp. rate 16, height 5\' 4"  (1.626 m), weight 107.5 kg, last menstrual period 12/17/2017.  Physical Exam  Nursing note and vitals reviewed. Constitutional: She is oriented to person, place, and time. She appears well-developed and well-nourished. No  distress.  HENT:  Head: Normocephalic.  Cardiovascular: Normal rate.  Respiratory: Effort normal.  GI: Soft. There is no tenderness. There is no rebound.  Unable to doppler FHT   Genitourinary:  Genitourinary Comments:  External: no lesion Vagina: small amount of white discharge Cervix: closed/thick  Uterus: minimally enlarged, but difficult to assess 2/2 body habitus   Neurological: She is alert and oriented to person, place, and time.  Skin: Skin is warm and dry.  Psychiatric: She has a normal mood and affect.   Results for orders placed or performed during the hospital encounter of 03/27/18 (from the past 24 hour(s))  Urinalysis, Routine w reflex microscopic     Status: None   Collection Time: 03/27/18 10:18 AM  Result Value Ref Range   Color, Urine YELLOW YELLOW   APPearance CLEAR CLEAR   Specific Gravity, Urine 1.023 1.005 - 1.030   pH 7.0 5.0 - 8.0   Glucose, UA NEGATIVE NEGATIVE mg/dL   Hgb urine dipstick NEGATIVE NEGATIVE   Bilirubin Urine NEGATIVE NEGATIVE   Ketones, ur NEGATIVE NEGATIVE mg/dL   Protein, ur NEGATIVE NEGATIVE mg/dL   Nitrite NEGATIVE NEGATIVE   Leukocytes, UA NEGATIVE NEGATIVE  Pregnancy, urine POC     Status: Abnormal   Collection Time: 03/27/18 10:22 AM  Result Value Ref Range   Preg Test, Ur POSITIVE (A) NEGATIVE  Wet prep, genital     Status: Abnormal   Collection Time: 03/27/18 10:54 AM  Result Value Ref Range   Yeast Wet Prep HPF POC NONE SEEN NONE SEEN   Trich, Wet Prep PRESENT (A) NONE SEEN   Clue Cells Wet Prep HPF POC NONE SEEN NONE SEEN   WBC, Wet Prep HPF POC NONE SEEN NONE SEEN   Sperm PRESENT   CBC     Status: Abnormal   Collection Time: 03/27/18 11:34 AM  Result Value Ref Range   WBC 6.8 4.0 - 10.5 K/uL   RBC 4.52 3.87 - 5.11 MIL/uL   Hemoglobin 11.5 (L) 12.0 - 15.0 g/dL   HCT 16.1 09.6 - 04.5 %   MCV 79.6 78.0 - 100.0 fL   MCH 25.4 (L) 26.0 - 34.0 pg   MCHC 31.9 30.0 - 36.0 g/dL   RDW 40.9 81.1 - 91.4 %   Platelets 312  150 - 400 K/uL   US Ob Comp Less 14 Wks  Result Date: 03/27/2018 CLINICAL DATA:  Pelvic pain.  Positive pregnancy test. EXAM: OBSTETRIC <14 WK [place] AND TRANSVAGINAL OB US TECHNIQUE: Both transabdominal and transvaginal ultrasound examinations were performed for complete evaluation of the gestation as well as the maternal uterus, adnexal regions, and pelvic cul-de-sac. Transvaginal technique was performed to assess early pregnancy. COMPARISON:  None. FINDINGS: Intrauterine gestational sac: Present Yolk sac:  None Embryo:  None Cardiac Activity: N/A Heart Rate: N/A bpm MSD: 2.39 mm   4 w   6 d Subchorionic hemorrhage:  None visualized. Maternal uterus/adnexae: Both ovaries are normal. Corpus luteum cyst noted on the left. Trace free pelvic fluid. IMPRESSION: Probable early intrauterine gestational sac, but no yolk sac, fetal pole, or cardiac activity yet visualized. Recommend follow-up quantitative B-HCG levels and follow-up US in 14 days to assess viability. This recommendation follows SRU consensus guidelines: Diagnostic Criteria for Nonviable Pregnancy Early in the First Trimester. Malva Limes Med 2013; 161:0960-45. Normal ovaries.  Corpus luteum cyst noted on the left. Electronically Signed   By: Rudie Meyer M.D.   On: 03/27/2018 13:07   US Ob Transvaginal  Result Date: 03/27/2018 CLINICAL DATA:  Pelvic pain.  Positive pregnancy test. EXAM: OBSTETRIC <14 WK [place] AND TRANSVAGINAL OB US TECHNIQUE: Both transabdominal and transvaginal ultrasound examinations were performed for complete evaluation of the gestation as well as the maternal uterus, adnexal regions, and pelvic cul-de-sac. Transvaginal technique was performed to assess early pregnancy. COMPARISON:  None. FINDINGS: Intrauterine gestational sac: Present Yolk sac:  None Embryo:  None Cardiac Activity: N/A Heart Rate: N/A bpm MSD: 2.39 mm   4 w   6 d Subchorionic hemorrhage:  None visualized. Maternal uterus/adnexae: Both ovaries are normal. Corpus luteum  cyst noted on the left. Trace free pelvic fluid. IMPRESSION: Probable early intrauterine gestational sac, but no yolk sac, fetal pole, or cardiac activity yet visualized. Recommend follow-up quantitative B-HCG levels and follow-up US in 14 days to assess viability. This recommendation follows SRU consensus guidelines: Diagnostic Criteria for Nonviable Pregnancy Early in the First Trimester. Malva Limes Med 2013; 409:8119-14. Normal ovaries.  Corpus luteum cyst noted on the left. Electronically Signed   By: Rudie Meyer M.D.   On: 03/27/2018 13:07    MAU Course  Procedures  MDM Patient treated with 2g flagyl here in MAU  EPT provided for the patient's partner   Assessment and Plan   1. Pregnancy, location unknown   2. Pelvic pain in pregnancy, antepartum, first trimester   3. Trichomoniasis    DC home Comfort measures reviewed  1st Trimester precautions  Bleeding precautions Ectopic precautions RX: EPT provided for patient's partner  Return to MAU as needed FU with OB as planned  Follow-up Information    [facility].   Specialty:  Obstetrics and Gynecology Contact information: [address] [place] [address] [phone]           Thressa Sheller 03/27/2018, 10:41 AM

## 2018-03-28 LAB — GC/CHLAMYDIA PROBE AMP (~~LOC~~) NOT AT ARMC
CHLAMYDIA, DNA PROBE: NEGATIVE
NEISSERIA GONORRHEA: NEGATIVE

## 2018-03-29 ENCOUNTER — Ambulatory Visit (INDEPENDENT_AMBULATORY_CARE_PROVIDER_SITE_OTHER): Payer: Managed Care, Other (non HMO) | Admitting: General Practice

## 2018-03-29 DIAGNOSIS — O3680X0 Pregnancy with inconclusive fetal viability, not applicable or unspecified: Secondary | ICD-10-CM

## 2018-03-29 DIAGNOSIS — O283 Abnormal ultrasonic finding on antenatal screening of mother: Secondary | ICD-10-CM

## 2018-03-29 LAB — HCG, QUANTITATIVE, PREGNANCY: hCG, Beta Chain, Quant, S: 3507 m[IU]/mL — ABNORMAL HIGH (ref <5)

## 2018-03-29 NOTE — Progress Notes (Signed)
Patient presents to office today for stat bhcg. Patient denies pain or bleeding. Discussed with patient we are monitoring your bhcg levels today & asked she wait in lobby for results/updated plan of care. Patient verbalized understanding & had no questions at this time.  Reviewed results with Dr Adrian BlackwaterStinson who finds appropriate rise in bhcg levels, patient should have follow up ultrasound in 2 weeks. Scheduled for 9/26 @ 10am.  Informed patient of results & discussed follow up ultrasound appt. Patient verbalized understanding & had no questions.

## 2018-03-29 NOTE — Progress Notes (Signed)
Chart reviewed - agree with RN documentation.   

## 2018-04-04 ENCOUNTER — Encounter (HOSPITAL_COMMUNITY): Payer: Self-pay

## 2018-04-12 ENCOUNTER — Ambulatory Visit (INDEPENDENT_AMBULATORY_CARE_PROVIDER_SITE_OTHER): Payer: Managed Care, Other (non HMO) | Admitting: General Practice

## 2018-04-12 ENCOUNTER — Ambulatory Visit (HOSPITAL_COMMUNITY)
Admission: RE | Admit: 2018-04-12 | Discharge: 2018-04-12 | Disposition: A | Discharge status: Home / Self Care | Payer: Managed Care, Other (non HMO) | Source: Ambulatory Visit | Attending: Family Medicine | Admitting: Family Medicine

## 2018-04-12 DIAGNOSIS — O283 Abnormal ultrasonic finding on antenatal screening of mother: Secondary | ICD-10-CM | POA: Insufficient documentation

## 2018-04-12 DIAGNOSIS — Z3A01 Less than 8 weeks gestation of pregnancy: Secondary | ICD-10-CM | POA: Insufficient documentation

## 2018-04-12 DIAGNOSIS — Z712 Person consulting for explanation of examination or test findings: Secondary | ICD-10-CM

## 2018-04-12 DIAGNOSIS — O3680X0 Pregnancy with inconclusive fetal viability, not applicable or unspecified: Secondary | ICD-10-CM

## 2018-04-12 NOTE — Progress Notes (Signed)
Patient presents to office today for viability ultrasound results. Reviewed results with Dr Shawnie Pons, who finds living IUP- patient should begin prenatal care.  Informed patient of results, reviewed dating, & provided pictures. Recommended she begin taking prenatal vitamins & start OB care. Patient verbalized understanding to all & had no questions.

## 2018-04-12 NOTE — Progress Notes (Signed)
Patient seen and assessed by nursing staff.  Agree with documentation and plan.  

## 2018-05-28 LAB — OB RESULTS CONSOLE RPR: RPR: NONREACTIVE

## 2018-05-28 LAB — OB RESULTS CONSOLE ABO/RH: RH Type: NEGATIVE

## 2018-05-28 LAB — OB RESULTS CONSOLE ANTIBODY SCREEN: Antibody Screen: NEGATIVE

## 2018-05-28 LAB — OB RESULTS CONSOLE HIV ANTIBODY (ROUTINE TESTING): HIV: NONREACTIVE

## 2018-05-28 LAB — OB RESULTS CONSOLE RUBELLA ANTIBODY, IGM: Rubella: IMMUNE

## 2018-05-28 LAB — OB RESULTS CONSOLE HEPATITIS B SURFACE ANTIGEN: Hepatitis B Surface Ag: NEGATIVE

## 2018-05-29 ENCOUNTER — Encounter: Payer: Self-pay | Admitting: Family Medicine

## 2018-05-29 ENCOUNTER — Other Ambulatory Visit: Payer: Self-pay

## 2018-05-29 ENCOUNTER — Ambulatory Visit (INDEPENDENT_AMBULATORY_CARE_PROVIDER_SITE_OTHER): Payer: Managed Care, Other (non HMO) | Admitting: Family Medicine

## 2018-05-29 VITALS — BP 107/69 | HR 65 | Temp 98.3°F | Resp 17 | Ht 65.5 in | Wt 237.2 lb

## 2018-05-29 DIAGNOSIS — E6609 Other obesity due to excess calories: Secondary | ICD-10-CM

## 2018-05-29 DIAGNOSIS — Z6838 Body mass index (BMI) 38.0-38.9, adult: Secondary | ICD-10-CM | POA: Diagnosis not present

## 2018-05-29 DIAGNOSIS — Z3480 Encounter for supervision of other normal pregnancy, unspecified trimester: Secondary | ICD-10-CM

## 2018-05-29 NOTE — Progress Notes (Signed)
Chief Complaint  Patient presents with  . Establish Care    no concerns pt is 3 months pregnant.  Pt has fmla paperwork, I advised this paperwork is probably for your ob/gyn to complete when the time comes for you to go out on maternity leave.    HPI   Patient reports that she established with Gynecology at 23w3 She reports that she would like to see a PCP for FMLA for pregnancy  Obesity Prior to pregnancy she had issue with weight gain She reports that her pregnancy weight was 250 pounds and she does not feel like she can exercise like she should She was doing push ups and sit ups but now sit ups hurt Body mass index is 38.87 kg/m.   Past Medical History:  Diagnosis Date  . Bipolar disorder (HCC)   . Infection 2011   kidney infection, was treated  . Pregnancy induced hypertension   . Schizophrenic disorder (HCC)   . UTI (lower urinary tract infection)     No current outpatient medications on file.   No current facility-administered medications for this visit.     Allergies:  Allergies  Allergen Reactions  . Kiwi Extract Itching    Past Surgical History:  Procedure Laterality Date  . CESAREAN SECTION  07/04/2012   Procedure: CESAREAN SECTION;  Surgeon: Kathreen CosierBernard A Marshall, MD;  Location: [facility];  Service: Obstetrics;  Laterality: N/A;  . GANGLION CYST EXCISION    . TONSILLECTOMY      Social History   Socioeconomic History  . Marital status: Single    Spouse name: Not on file  . Number of children: Not on file  . Years of education: Not on file  . Highest education level: Not on file  Occupational History  . Not on file  Social Needs  . Financial resource strain: Not on file  . Food insecurity:    Worry: Not on file    Inability: Not on file  . Transportation needs:    Medical: Not on file    Non-medical: Not on file  Tobacco Use  . Smoking status: Never Smoker  . Smokeless tobacco: Never Used  Substance and Sexual Activity  . Alcohol use: Yes    Comment: socially  . Drug use: No  . Sexual activity: Yes    Birth control/protection: Condom  Lifestyle  . Physical activity:    Days per week: Not on file    Minutes per session: Not on file  . Stress: Not on file  Relationships  . Social connections:    Talks on phone: Not on file    Gets together: Not on file    Attends religious service: Not on file    Active member of club or organization: Not on file    Attends meetings of clubs or organizations: Not on file    Relationship status: Not on file  Other Topics Concern  . Not on file  Social History Narrative  . Not on file    Family History  Problem Relation Age of Onset  . Cancer Maternal Grandmother      ROS Review of Systems See HPI Constitution: No fevers or chills No malaise No diaphoresis Skin: No rash or itching Eyes: no blurry vision, no double vision GU: no dysuria or hematuria Neuro: no dizziness or headaches all others reviewed and negative   Objective: Vitals:   05/29/18 1109  BP: 107/69  Pulse: 65  Resp: 17  Temp: 98.3 F (36.8 C)  TempSrc: Oral  SpO2: 96%  Weight: 237 lb 3.2 oz (107.6 kg)  Height: 5' 5.5" (1.664 m)    Physical Exam Physical Exam  Constitutional: He is oriented to person, place, and time. He appears well-developed and well-nourished.  HENT:  Head: Normocephalic and atraumatic.  Eyes: Conjunctivae and EOM are normal.  Cardiovascular: Normal rate, regular rhythm, normal heart sounds and intact distal pulses.  No murmur heard. Pulmonary/Chest: Effort normal and breath sounds normal. No stridor. No respiratory distress. He has no wheezes.  Neurological: He is alert and oriented to person, place, and time.  Skin: Skin is warm. Capillary refill takes less than 2 seconds.  Psychiatric: He has a normal mood and affect. His behavior is normal. Judgment and thought content normal.   Assessment and Plan Lincy was seen today for establish care.  Diagnoses and all orders  for this visit:  Pregnancy in multigravida- advised consistent prenatal care with OB and bring forms for them to complete  Class 2 obesity due to excess calories without serious comorbidity with body mass index (BMI) of 38.0 to 38.9 in adult -  Discussed healthy weight gain in pregnancy and discussed exercise safety      Oshua Mcconaha A Creta Levin

## 2018-05-29 NOTE — Patient Instructions (Addendum)
If you have lab work done today you will be contacted with your lab results within the next 2 weeks.  If you have not heard from [place] then please contact us. The fastest way to get your results is to register for My Chart.   IF you received an x-ray today, you will receive an invoice from [facility]. Please contact [facility] at [phone] with questions or concerns regarding your invoice.   IF you received labwork today, you will receive an invoice from [facility]. Please contact [facility] at [phone] with questions or concerns regarding your invoice.   Our billing staff will not be able to assist you with questions regarding bills from these companies.  You will be contacted with the lab results as soon as they are available. The fastest way to get your results is to activate your My Chart account. Instructions are located on the last page of this paperwork. If you have not heard from [place] regarding the results in 2 weeks, please contact this office.    Exercise During Pregnancy For people of all ages, exercise is an important part of being healthy. Exercise improves heart and lung function and helps to maintain strength, flexibility, and a healthy body weight. Exercise also boosts energy levels and elevates mood. For most women, maintaining an exercise routine throughout pregnancy is recommended. It is only on rare occasions and with certain medical conditions or pregnancy complications that women may be asked to limit or avoid exercise during pregnancy. What are some other benefits to exercising during pregnancy? Along with maintaining strength and flexibility, exercising throughout pregnancy can help to:  Keep strength in muscles that are very important during labor and childbirth.  Decrease low back pain during pregnancy.  Decrease the risk of developing gestational diabetes mellitus (GDM).  Improve blood sugar (glucose) control for women who have  GDM.  Decrease the risk of developing preeclampsia. This is a serious condition that causes high blood pressure along with other symptoms, such as swelling and headaches.  Decrease the risk of cesarean delivery.  Speed up the recovery after giving birth.  How often should I exercise? Unless your health care provider gives you different instructions, you should try to exercise on most days or all days of the week. In general, try to exercise with moderate intensity for about 150 minutes per week. This can be spread out across several days, such as exercising for 30 minutes per day on 5 days of each week. You can tell that you are exercising at a moderate intensity if you have a higher heart rate and faster breathing, but you are still able to hold a conversation. What types of moderate-intensity exercise are recommended during pregnancy? There are many types of exercise that are safe for you to do during pregnancy. Unless your health care provider gives you different instructions, do a variety of exercises that safely increase your heart and breathing (cardiopulmonary) rates and help you to build and maintain muscle strength (strength training). You should always be able to talk in full sentences while exercising during pregnancy. Some examples of exercising that is safe to do during pregnancy include:  Brisk walking or hiking.  Swimming.  Water aerobics.  Riding a stationary bike.  Strength training.  Modified yoga or Pilates. Tell your instructor that you are pregnant. Avoid overstretching and avoid lying on your back for long periods of time.  Running or jogging. Only choose this type of exercise if: ? You ran or  jogged regularly before your pregnancy. ? You can run or jog and still talk in complete sentences.  What types of exercise should I not do during pregnancy? Depending on your level of fitness and whether you exercised regularly before your pregnancy, you may be advised to  limit vigorous-intensity exercise during your pregnancy. You can tell that you are exercising at a vigorous intensity if you are breathing much harder and faster and cannot hold a conversation while exercising. Some examples of exercising that you should avoid during pregnancy include:  Contact sports.  Activities that place you at risk for falling on or being hit in the belly, such as downhill skiing, water skiing, surfing, rock climbing, cycling, gymnastics, and horseback riding.  Scuba diving.  Sky diving.  Yoga or Pilates in a room that is heated to extreme temperatures ("hot yoga" or "hot Pilates").  Jogging or running, unless you ran or jogged regularly before your pregnancy. While jogging or running, you should always be able to talk in full sentences. Do not run or jog so vigorously that you are unable to have a conversation.  If you are not used to exercising at elevation (more than 6,000 feet above sea level), do not do so during your pregnancy.  When should I avoid exercising during pregnancy? Certain medical conditions can make it unsafe to exercise during pregnancy, or they may increase your risk of miscarriage or early labor and birth. Some of these conditions include:  Some types of heart disease.  Some types of lung disease.  Placenta previa. This is when the placenta partially or completely covers the opening of the uterus (cervix).  Frequent bleeding from the vagina during your pregnancy.  Incompetent cervix. This is when your cervix does not remain as tightly closed during pregnancy as it should.  Premature labor.  Ruptured membranes. This is when the protective sac (amniotic sac) opens up and amniotic fluid leaks from your vagina.  Severely low blood count (anemia).  Preeclampsia or pregnancy-caused high blood pressure.  Carrying more than one baby (multiple gestation) and having an additional risk of early labor.  Poorly controlled diabetes.  Being  severely underweight or severely overweight.  Intrauterine growth restriction. This is when your baby's growth and development during pregnancy are slower than expected.  Other medical conditions. Ask your health care provider if any apply to you.  What else should I know about exercising during pregnancy? You should take these precautions while exercising during pregnancy:  Avoid overheating. ? Wear loose-fitting, breathable clothes. ? Do not exercise in very high temperatures.  Avoid dehydration. Drink enough water before, during, and after exercise to keep your urine clear or pale yellow.  Avoid overstretching. Because of hormone changes during pregnancy, it is easy to overstretch muscles, tendons, and ligaments during pregnancy.  Start slowly and ask your health care provider to recommend types of exercise that are safe for you, if exercising regularly is new for you.  Pregnancy is not a time for exercising to lose weight. When should I seek medical care? You should stop exercising and call your health care provider if you have any unusual symptoms, such as:  Mild uterine contractions or abdominal cramping.  Dizziness that does not improve with rest.  When should I seek immediate medical care? You should stop exercising and call your local emergency services (911 in the [place] if you have any unusual symptoms, such as:  Sudden, severe pain in your low back or your belly.  Uterine contractions or  abdominal cramping that do not improve with rest.  Chest pain.  Bleeding or fluid leaking from your vagina.  Shortness of breath.  This information is not intended to replace advice given to you by your health care provider. Make sure you discuss any questions you have with your health care provider. Document Released: 07/04/2005 Document Revised: 12/02/2015 Document Reviewed: 09/11/2014 Elsevier Interactive Patient Education  [employer].

## 2018-05-30 ENCOUNTER — Encounter: Payer: Self-pay | Admitting: Family Medicine

## 2018-06-16 ENCOUNTER — Emergency Department (HOSPITAL_BASED_OUTPATIENT_CLINIC_OR_DEPARTMENT_OTHER)
Admission: EM | Admit: 2018-06-16 | Discharge: 2018-06-16 | Disposition: A | Discharge status: Home / Self Care | Payer: Managed Care, Other (non HMO) | Attending: Emergency Medicine | Admitting: Emergency Medicine

## 2018-06-16 ENCOUNTER — Encounter (HOSPITAL_BASED_OUTPATIENT_CLINIC_OR_DEPARTMENT_OTHER): Payer: Self-pay | Admitting: Emergency Medicine

## 2018-06-16 ENCOUNTER — Other Ambulatory Visit: Payer: Self-pay

## 2018-06-16 DIAGNOSIS — Z3A15 15 weeks gestation of pregnancy: Secondary | ICD-10-CM | POA: Insufficient documentation

## 2018-06-16 DIAGNOSIS — O209 Hemorrhage in early pregnancy, unspecified: Secondary | ICD-10-CM | POA: Diagnosis present

## 2018-06-16 DIAGNOSIS — O99012 Anemia complicating pregnancy, second trimester: Secondary | ICD-10-CM | POA: Insufficient documentation

## 2018-06-16 DIAGNOSIS — O4692 Antepartum hemorrhage, unspecified, second trimester: Secondary | ICD-10-CM | POA: Diagnosis not present

## 2018-06-16 DIAGNOSIS — D649 Anemia, unspecified: Secondary | ICD-10-CM

## 2018-06-16 LAB — CBC WITH DIFFERENTIAL/PLATELET
ABS IMMATURE GRANULOCYTES: 0.05 10*3/uL (ref 0.00–0.07)
BASOS PCT: 0 %
Basophils Absolute: 0 10*3/uL (ref 0.0–0.1)
Eosinophils Absolute: 0.1 10*3/uL (ref 0.0–0.5)
Eosinophils Relative: 1 %
HCT: 33 % — ABNORMAL LOW (ref 36.0–46.0)
Hemoglobin: 10.6 g/dL — ABNORMAL LOW (ref 12.0–15.0)
IMMATURE GRANULOCYTES: 1 %
Lymphocytes Relative: 24 %
Lymphs Abs: 2.4 10*3/uL (ref 0.7–4.0)
MCH: 25.7 pg — ABNORMAL LOW (ref 26.0–34.0)
MCHC: 32.1 g/dL (ref 30.0–36.0)
MCV: 80.1 fL (ref 80.0–100.0)
Monocytes Absolute: 0.7 10*3/uL (ref 0.1–1.0)
Monocytes Relative: 7 %
NEUTROS ABS: 6.9 10*3/uL (ref 1.7–7.7)
NEUTROS PCT: 67 %
NRBC: 0 % (ref 0.0–0.2)
PLATELETS: 254 10*3/uL (ref 150–400)
RBC: 4.12 MIL/uL (ref 3.87–5.11)
RDW: 15.8 % — ABNORMAL HIGH (ref 11.5–15.5)
WBC: 10.2 10*3/uL (ref 4.0–10.5)

## 2018-06-16 LAB — BASIC METABOLIC PANEL
ANION GAP: 8 (ref 5–15)
BUN: 5 mg/dL — AB (ref 6–20)
CO2: 19 mmol/L — ABNORMAL LOW (ref 22–32)
Calcium: 8.5 mg/dL — ABNORMAL LOW (ref 8.9–10.3)
Chloride: 107 mmol/L (ref 98–111)
Creatinine, Ser: 0.53 mg/dL (ref 0.44–1.00)
GFR calc Af Amer: >60 mL/min (ref >60)
GLUCOSE: 82 mg/dL (ref 70–99)
POTASSIUM: 3.4 mmol/L — AB (ref 3.5–5.1)
Sodium: 134 mmol/L — ABNORMAL LOW (ref 135–145)

## 2018-06-16 LAB — URINALYSIS, ROUTINE W REFLEX MICROSCOPIC
Bilirubin Urine: NEGATIVE
Glucose, UA: NEGATIVE mg/dL
Hgb urine dipstick: NEGATIVE
KETONES UR: NEGATIVE mg/dL
LEUKOCYTES UA: NEGATIVE
NITRITE: NEGATIVE
PROTEIN: NEGATIVE mg/dL
Specific Gravity, Urine: >1.03 — ABNORMAL HIGH (ref 1.005–1.030)
pH: 5.5 (ref 5.0–8.0)

## 2018-06-16 LAB — HCG, QUANTITATIVE, PREGNANCY: HCG, BETA CHAIN, QUANT, S: 16963 m[IU]/mL — AB (ref <5)

## 2018-06-16 MED ORDER — RHO D IMMUNE GLOBULIN 1500 UNIT/2ML IJ SOSY
300.0000 ug | PREFILLED_SYRINGE | Freq: Once | INTRAMUSCULAR | Status: AC
  Administered 2018-06-16: 300 ug via INTRAMUSCULAR
  Filled 2018-06-16: qty 2

## 2018-06-16 NOTE — ED Provider Notes (Signed)
[facility] EMERGENCY DEPARTMENT Provider Note   CSN: [id number] Arrival date & time: 06/16/18  1536     History   Chief Complaint Chief Complaint  Patient presents with  . Vaginal Bleeding    HPI Amanda Campos is a 27 y.o. female.  HPI  Patient is a [id number] 27 year old female with a history of bipolar disorder, preeclampsia with first pregnancy, and currently 15 to 16 weeks based on ultrasound performed on 04-12-2018 presenting for single episode of vaginal bleeding and abdominal cramping today.  Patient reports that she has not had any further bleeding over the last couple hours, and her abdominal cramping has resolved.  She reports that upon going to the bathroom, she noted a singular, dime sized clot of blood in her underwear.  Prior to noticing this, she had a "cramp" that extended from the right to the left abdomen.  Patient in no acute distress now.  Denies any nausea, vomiting, abdominal pain, back pain, vaginal discharge or further vaginal bleeding.  No fever or chills.  Prior lab work demonstrating that patient has O negative blood.   Past Medical History:  Diagnosis Date  . Bipolar disorder (HCC)   . Infection 2011   kidney infection, was treated  . Pregnancy induced hypertension   . Schizophrenic disorder (HCC)   . UTI (lower urinary tract infection)     Patient Active Problem List   Diagnosis Date Noted  . Late prenatal care 07/03/2012  . Hypertension complicating pregnancy 07/03/2012    Past Surgical History:  Procedure Laterality Date  . CESAREAN SECTION  07/04/2012   Procedure: CESAREAN SECTION;  Surgeon: Kathreen CosierBernard A Marshall, MD;  Location: [facility];  Service: Obstetrics;  Laterality: N/A;  . GANGLION CYST EXCISION    . TONSILLECTOMY       OB History    Gravida  2   Para  1   Term  0   Preterm  1   AB  0   Living  1     SAB  0   TAB  0   Ectopic  0   Multiple  0   Live Births  1            Home Medications    Prior  to Admission medications   Not on File    Family History Family History  Problem Relation Age of Onset  . Cancer Maternal Grandmother     Social History Social History   Tobacco Use  . Smoking status: Never Smoker  . Smokeless tobacco: Never Used  Substance Use Topics  . Alcohol use: Yes    Comment: socially  . Drug use: No     Allergies   Kiwi extract   Review of Systems Review of Systems  Constitutional: Negative for chills and fever.  HENT: Negative for congestion and sore throat.   Respiratory: Negative for cough, chest tightness and shortness of breath.   Cardiovascular: Negative for chest pain.  Gastrointestinal: Negative for abdominal pain, nausea and vomiting.  Genitourinary: Positive for vaginal bleeding. Negative for decreased urine volume, dysuria, flank pain, pelvic pain, urgency and vaginal discharge.  Musculoskeletal: Negative for back pain and myalgias.  Skin: Negative for rash.  All other systems reviewed and are negative.    Physical Exam Updated Vital Signs BP 124/90 (BP Location: Left Arm)   Pulse 90   Temp 98.3 F (36.8 C) (Oral)   Resp 18   Ht 5\' 4"  (1.626 m)   Wt 106.1  kg   LMP 12/17/2017 (Approximate)   SpO2 100%   BMI 40.17 kg/m   Physical Exam  Constitutional: She appears well-developed and well-nourished. No distress.  HENT:  Head: Normocephalic and atraumatic.  Mouth/Throat: Oropharynx is clear and moist.  Eyes: Pupils are equal, round, and reactive to light. Conjunctivae and EOM are normal.  Neck: Normal range of motion. Neck supple.  Cardiovascular: Normal rate, regular rhythm, S1 normal and S2 normal.  No murmur heard. Pulmonary/Chest: Effort normal and breath sounds normal. She has no wheezes. She has no rales.  Abdominal: Soft. She exhibits no distension. There is no tenderness. There is no guarding.  Genitourinary:  Genitourinary Comments: Pelvic examination performed with EMT chaperone present.  Patient has no  external lesions of the vagina or perineum.  Sterile speculum exam was performed.  There was no blood within the vaginal vault.  No products or blood extruding from cervical os.  Cervix appears closed.  Bimanual exam deferred secondary to imaging to assess for location of placenta.  Musculoskeletal: Normal range of motion. She exhibits no edema or deformity.  Neurological: She is alert.  Cranial nerves grossly intact. Patient moves extremities symmetrically and with good coordination.  Skin: Skin is warm and dry. No rash noted. No erythema.  Psychiatric: She has a normal mood and affect. Her behavior is normal. Judgment and thought content normal.  Nursing note and vitals reviewed.    ED Treatments / Results  Labs (all labs ordered are listed, but only abnormal results are displayed) Labs Reviewed  URINALYSIS, ROUTINE W REFLEX MICROSCOPIC - Abnormal; Notable for the following components:      Result Value   APPearance CLOUDY (*)    Specific Gravity, Urine >1.030 (*)    All other components within normal limits  CBC WITH DIFFERENTIAL/PLATELET - Abnormal; Notable for the following components:   Hemoglobin 10.6 (*)    HCT 33.0 (*)    MCH 25.7 (*)    RDW 15.8 (*)    All other components within normal limits  BASIC METABOLIC PANEL - Abnormal; Notable for the following components:   Sodium 134 (*)    Potassium 3.4 (*)    CO2 19 (*)    BUN 5 (*)    Calcium 8.5 (*)    All other components within normal limits  HCG, QUANTITATIVE, PREGNANCY - Abnormal; Notable for the following components:   hCG, Beta Chain, Quant, S 16,109 (*)    All other components within normal limits    EKG None  Radiology No results found.  Procedures Procedures (including critical care time)  Medications Ordered in ED Medications - No data to display   Initial Impression / Assessment and Plan / ED Course  I have reviewed the triage vital signs and the nursing notes.  Pertinent labs & imaging results  that were available during my care of the patient were reviewed by me and considered in my medical decision making (see chart for details).  Clinical Course as of Jun 16 1852  Sat Jun 16, 2018  6045 Chart reviews chronic anemia.   Hemoglobin(!): 10.6 [AM]  1840 FHTs 142.   [AM]    Clinical Course User Index [AM] Elisha Ponder, PA-C    Patient nontoxic-appearing, afebrile, and with benign abdomen.  Patient with approximately 15 weeks 6-day pregnancy known intrauterine based on ultrasound on 04-12-2018.  Patient was singular episode of bleeding earlier today, with complete resolution now, no other abnormal findings on exam.  Lab work concerning chronic  anemia with hemoglobin of 10.6.  Will discuss with [facility] on-call provider.  Case was discussed with Dr. Tenny Craw of [facility].  On further questioning the patient, uncovered that patient had sexual intercourse within 12 hours of the bleeding episode.  Dr. Tenny Craw stated that this is likely the cause of patient's bleeding.  Recommends RhoGam administration.  Recommended patient maintain regular follow-up, does not need urgent or emergent ultrasound at this time.  Fetal heart tones are 142.  Lab work unremarkable with urine with no evidence of infection, stable anemia with hemoglobin of 10.6, BMP with slight hypokalemia but without any other acute concerning abnormalities.   Return precautions were given to patient for any increased bleeding episode, abdominal cramping, or any new or worsening symptoms.  Final Clinical Impressions(s) / ED Diagnoses   Final diagnoses:  Vaginal bleeding in pregnancy, second trimester  Anemia, unspecified type    ED Discharge Orders    None       Elisha Ponder, PA-C 06/16/18 1858    Rolan Bucco, MD 06/16/18 2300

## 2018-06-16 NOTE — ED Triage Notes (Signed)
Patient states that she is about [redacted] weeks pregnant. She had some cramps and lowerback pain earlier today and then has a large " reb blob" come out at about 1430 - the patient denies any bleeding at this time

## 2018-06-16 NOTE — Discharge Instructions (Addendum)
Please see the information and instructions below regarding your visit.  Your diagnoses today include:  1. Vaginal bleeding in pregnancy, second trimester   2. Anemia, unspecified type     Tests performed today include: See side panel of your discharge paperwork for testing performed today. Vital signs are listed at the bottom of these instructions.   You have evidence of anemia on your lab work today.  This appears chronic.  Please continue to have this followed by her OB/GYN.  Medications prescribed:    Take any prescribed medications only as prescribed, and any over the counter medications only as directed on the packaging.  Home care instructions:  Please follow any educational materials contained in this packet.   Follow-up instructions: Please help with your OB/GYN based on your previously scheduled appointments.  Return instructions:  Please return to the Emergency Department if you experience worsening symptoms.  Return to the emergency department for any increase in bleeding, abdominal cramping, nausea or vomiting the prevention keeping anything down, or any new or worsening symptoms. Please return if you have any other emergent concerns.  Additional Information:   Your vital signs today were: BP 113/78 (BP Location: Left Arm)    Pulse 88    Temp 98.3 F (36.8 C) (Oral)    Resp 18    Ht 5\' 4"  (1.626 m)    Wt 106.1 kg    LMP 12/17/2017 (Approximate)    SpO2 100%    BMI 40.17 kg/m  If your blood pressure (BP) was elevated on multiple readings during this visit above 130 for the top number or above 80 for the bottom number, please have this repeated by your primary care provider within one month. --------------  Thank you for allowing us to participate in your care today.

## 2018-08-01 DIAGNOSIS — Z369 Encounter for antenatal screening, unspecified: Secondary | ICD-10-CM | POA: Diagnosis not present

## 2018-08-01 DIAGNOSIS — E039 Hypothyroidism, unspecified: Secondary | ICD-10-CM | POA: Diagnosis not present

## 2018-08-13 ENCOUNTER — Inpatient Hospital Stay (HOSPITAL_COMMUNITY)
Admission: AD | Admit: 2018-08-13 | Discharge: 2018-08-13 | Disposition: A | Discharge status: Home / Self Care | Payer: BLUE CROSS/BLUE SHIELD | Source: Ambulatory Visit | Attending: Obstetrics and Gynecology | Admitting: Obstetrics and Gynecology

## 2018-08-13 ENCOUNTER — Encounter (HOSPITAL_COMMUNITY): Payer: Self-pay | Admitting: *Deleted

## 2018-08-13 ENCOUNTER — Inpatient Hospital Stay (HOSPITAL_BASED_OUTPATIENT_CLINIC_OR_DEPARTMENT_OTHER): Payer: BLUE CROSS/BLUE SHIELD

## 2018-08-13 DIAGNOSIS — O99210 Obesity complicating pregnancy, unspecified trimester: Secondary | ICD-10-CM

## 2018-08-13 DIAGNOSIS — O09212 Supervision of pregnancy with history of pre-term labor, second trimester: Secondary | ICD-10-CM

## 2018-08-13 DIAGNOSIS — W109XXA Fall (on) (from) unspecified stairs and steps, initial encounter: Secondary | ICD-10-CM | POA: Insufficient documentation

## 2018-08-13 DIAGNOSIS — Z3689 Encounter for other specified antenatal screening: Secondary | ICD-10-CM | POA: Diagnosis not present

## 2018-08-13 DIAGNOSIS — O9A212 Injury, poisoning and certain other consequences of external causes complicating pregnancy, second trimester: Secondary | ICD-10-CM | POA: Diagnosis not present

## 2018-08-13 DIAGNOSIS — W228XXA Striking against or struck by other objects, initial encounter: Secondary | ICD-10-CM | POA: Diagnosis not present

## 2018-08-13 DIAGNOSIS — Z3A24 24 weeks gestation of pregnancy: Secondary | ICD-10-CM

## 2018-08-13 DIAGNOSIS — W19XXXA Unspecified fall, initial encounter: Secondary | ICD-10-CM | POA: Diagnosis not present

## 2018-08-13 DIAGNOSIS — O34219 Maternal care for unspecified type scar from previous cesarean delivery: Secondary | ICD-10-CM

## 2018-08-13 DIAGNOSIS — O26892 Other specified pregnancy related conditions, second trimester: Secondary | ICD-10-CM | POA: Diagnosis not present

## 2018-08-13 LAB — URINALYSIS, ROUTINE W REFLEX MICROSCOPIC
Bilirubin Urine: NEGATIVE
Glucose, UA: 150 mg/dL — AB
Hgb urine dipstick: NEGATIVE
Ketones, ur: NEGATIVE mg/dL
LEUKOCYTES UA: NEGATIVE
NITRITE: NEGATIVE
PROTEIN: NEGATIVE mg/dL
Specific Gravity, Urine: 1.02 (ref 1.005–1.030)
pH: 6 (ref 5.0–8.0)

## 2018-08-13 MED ORDER — PROMETHAZINE HCL 25 MG/ML IJ SOLN: 25.0000 mg | Freq: Once | INTRAMUSCULAR | Status: DC

## 2018-08-13 NOTE — MAU Provider Note (Addendum)
History     CSN: [id number]  Arrival date and time: 08/13/18 1719   First Provider Initiated Contact with Patient 08/13/18 1845      Chief Complaint  Patient presents with  . Fall   HPI  Ms.  Amanda Campos is a 28 y.o. year old G12P0101 female at [redacted]w[redacted]d weeks gestation who presents to MAU reporting she "slipped down 8 steps about 1400 or 1430 today." She "tried to grab the banister, but it didn't help much." She reports she "hit upper abdomen on the banister" during the fall. She denies any bruising or pain. She She reports she felt "very nauseated and light-headed immediately after the fall." She vomited once she got in the house, went to lie down until the FOB came home. She reports "slight" (bilateral) hip and abdominal pain. She denies VB or LOF. She reports good (+) FM since the fall. She didn't call her doctor's office, because they were closed by the time the FOB got home. She receives her PNC at [facility].    Past Medical History:  Diagnosis Date  . Infection 2011   kidney infection, was treated  . Pregnancy induced hypertension   . UTI (lower urinary tract infection)     Past Surgical History:  Procedure Laterality Date  . CESAREAN SECTION  07/04/2012   Procedure: CESAREAN SECTION;  Surgeon: Kathreen Cosier, MD;  Location: [facility];  Service: Obstetrics;  Laterality: N/A;  . GANGLION CYST EXCISION    . TONSILLECTOMY      Family History  Problem Relation Age of Onset  . Cancer Maternal Grandmother     Social History   Tobacco Use  . Smoking status: Never Smoker  . Smokeless tobacco: Never Used  Substance Use Topics  . Alcohol use: Not Currently    Comment: socially  . Drug use: No    Allergies:  Allergies  Allergen Reactions  . Kiwi Extract Itching    No medications prior to admission.    Review of Systems  Constitutional: Negative.   HENT: Negative.   Eyes: Negative.   Respiratory: Negative.   Cardiovascular: Negative.   Gastrointestinal: Negative.    Endocrine: Negative.   Genitourinary:       Hit abdomen on banister while falling down the stairs  Musculoskeletal: Negative.   Skin: Negative.   Allergic/Immunologic: Negative.   Neurological: Negative.   Hematological: Negative.   Psychiatric/Behavioral: Negative.    Physical Exam   Blood pressure 128/77, pulse (!) 104, temperature 98.3 F (36.8 C), temperature source Oral, resp. rate 16, height 5\' 4"  (1.626 m), weight 108.4 kg, last menstrual period 12/17/2017, SpO2 100 %.  Physical Exam  Nursing note and vitals reviewed. Constitutional: She is oriented to person, place, and time. She appears well-developed and well-nourished.  HENT:  Head: Normocephalic and atraumatic.  Eyes: Pupils are equal, round, and reactive to light.  Neck: Normal range of motion.  Cardiovascular: Normal rate.  Respiratory: Effort normal.  GI: Soft.  Genitourinary:    Genitourinary Comments: Pelvic deferred   Musculoskeletal: Normal range of motion.  Neurological: She is alert and oriented to person, place, and time.  Skin: Skin is warm and dry.  Psychiatric: She has a normal mood and affect. Her behavior is normal. Judgment and thought content normal.    MAU Course  Procedures  MDM CCUA OB MFM Limited U/S Prolonged NST - FHR: 140 bpm / moderate variability / accels present / decels absent / TOCO: none  Results for orders placed or  performed during the hospital encounter of 08/13/18 (from the past 24 hour(s))  Urinalysis, Routine w reflex microscopic     Status: Abnormal   Collection Time: 08/13/18  6:39 PM  Result Value Ref Range   Color, Urine YELLOW YELLOW   APPearance HAZY (A) CLEAR   Specific Gravity, Urine 1.020 1.005 - 1.030   pH 6.0 5.0 - 8.0   Glucose, UA 150 (A) NEGATIVE mg/dL   Hgb urine dipstick NEGATIVE NEGATIVE   Bilirubin Urine NEGATIVE NEGATIVE   Ketones, ur NEGATIVE NEGATIVE mg/dL   Protein, ur NEGATIVE NEGATIVE mg/dL   Nitrite NEGATIVE NEGATIVE   Leukocytes, UA  NEGATIVE NEGATIVE     Report given to and care assumed by Steward Drone, CNM @ 2020  Raelyn Mora, MSN, CNM 08/13/2018, 6:53 PM   [place] report reviewed:  Anterior placenta, no placental abruption or previa identified  FHR 140, breech presentation  AFV WNL, CL 5.1, no abnormalities found on [place]  Patient received Rhogam 8 weeks ago on 06/16/2018 for vaginal bleeding, no Rhogam needed today in MAU- coverage present. Follow up with Rhogam as scheduled in 4 weeks at 28 weeks.   Extended monitoring of 4 hours completed, patient denies abdominal pain or abdominal cramping.  FHR 140/ moderate/+accels/ no decelerations  Toco: no uterine contractions   Educated and discussed preventing injuries during pregnancy and the need to be evaluated immediately after injury. Follow up as scheduled in the office for prenatal appointments and return to MAU as needed for evaluation. Discussed reasons to return to MAU with abdominal pain, vaginal bleeding after fall. Pt stable at time of discharge.   Assessment and Plan   1. Fall, initial encounter   2. Traumatic injury during pregnancy in second trimester   3. [redacted] weeks gestation of pregnancy   4. NST (non-stress test) reactive    Discharge home  NST reactive for gestational age Follow up with OBGYN for prenatal care  Return to MAU as needed  Follow-up Information    Ob/Gyn, [facility]. Go to.   Why:  Follow up as scheduled for prenatal appointments  Contact information: [address] [place] [address] [phone]          Sharyon Cable, CNM 08/13/18, 11:30 PM

## 2018-08-13 NOTE — MAU Note (Signed)
Pt reports she slipped down 8 steps about 4 hours ago, having bilateral hip pain, slight lower abd cramping. Denies bleeding. Reports positive fetal movement.

## 2018-08-31 DIAGNOSIS — Z3480 Encounter for supervision of other normal pregnancy, unspecified trimester: Secondary | ICD-10-CM | POA: Diagnosis not present

## 2018-08-31 DIAGNOSIS — Z369 Encounter for antenatal screening, unspecified: Secondary | ICD-10-CM | POA: Diagnosis not present

## 2018-08-31 DIAGNOSIS — E039 Hypothyroidism, unspecified: Secondary | ICD-10-CM | POA: Diagnosis not present

## 2018-09-10 DIAGNOSIS — O360990 Maternal care for other rhesus isoimmunization, unspecified trimester, not applicable or unspecified: Secondary | ICD-10-CM | POA: Diagnosis not present

## 2018-09-10 DIAGNOSIS — Z0183 Encounter for blood typing: Secondary | ICD-10-CM | POA: Diagnosis not present

## 2018-09-10 DIAGNOSIS — Z23 Encounter for immunization: Secondary | ICD-10-CM | POA: Diagnosis not present

## 2018-09-14 DIAGNOSIS — J020 Streptococcal pharyngitis: Secondary | ICD-10-CM | POA: Diagnosis not present

## 2018-09-14 DIAGNOSIS — R110 Nausea: Secondary | ICD-10-CM | POA: Diagnosis not present

## 2018-09-19 DIAGNOSIS — Z369 Encounter for antenatal screening, unspecified: Secondary | ICD-10-CM | POA: Diagnosis not present

## 2018-09-25 ENCOUNTER — Inpatient Hospital Stay (HOSPITAL_COMMUNITY)
Admission: AD | Admit: 2018-09-25 | Discharge: 2018-09-25 | Disposition: A | Discharge status: Home / Self Care | Payer: BLUE CROSS/BLUE SHIELD | Attending: Obstetrics and Gynecology | Admitting: Obstetrics and Gynecology

## 2018-09-25 ENCOUNTER — Encounter (HOSPITAL_COMMUNITY): Payer: Self-pay | Admitting: *Deleted

## 2018-09-25 ENCOUNTER — Other Ambulatory Visit: Payer: Self-pay

## 2018-09-25 DIAGNOSIS — R197 Diarrhea, unspecified: Secondary | ICD-10-CM | POA: Diagnosis not present

## 2018-09-25 DIAGNOSIS — O4703 False labor before 37 completed weeks of gestation, third trimester: Secondary | ICD-10-CM

## 2018-09-25 DIAGNOSIS — Z3A38 38 weeks gestation of pregnancy: Secondary | ICD-10-CM | POA: Diagnosis not present

## 2018-09-25 DIAGNOSIS — Z809 Family history of malignant neoplasm, unspecified: Secondary | ICD-10-CM | POA: Diagnosis not present

## 2018-09-25 DIAGNOSIS — Z3A30 30 weeks gestation of pregnancy: Secondary | ICD-10-CM | POA: Insufficient documentation

## 2018-09-25 DIAGNOSIS — Z91018 Allergy to other foods: Secondary | ICD-10-CM | POA: Diagnosis not present

## 2018-09-25 DIAGNOSIS — O9989 Other specified diseases and conditions complicating pregnancy, childbirth and the puerperium: Secondary | ICD-10-CM | POA: Diagnosis not present

## 2018-09-25 NOTE — Discharge Instructions (Signed)
Braxton Hicks Contractions Contractions of the uterus can occur throughout pregnancy, but they are not always a sign that you are in labor. You may have practice contractions called Braxton Hicks contractions. These false labor contractions are sometimes confused with true labor. What are Braxton Hicks contractions? Braxton Hicks contractions are tightening movements that occur in the muscles of the uterus before labor. Unlike true labor contractions, these contractions do not result in opening (dilation) and thinning of the cervix. Toward the end of pregnancy (32-34 weeks), Braxton Hicks contractions can happen more often and may become stronger. These contractions are sometimes difficult to tell apart from true labor because they can be very uncomfortable. You should not feel embarrassed if you go to the hospital with false labor. Sometimes, the only way to tell if you are in true labor is for your health care provider to look for changes in the cervix. The health care provider will do a physical exam and may monitor your contractions. If you are not in true labor, the exam should show that your cervix is not dilating and your water has not broken. If there are no other health problems associated with your pregnancy, it is completely safe for you to be sent home with false labor. You may continue to have Braxton Hicks contractions until you go into true labor. How to tell the difference between true labor and false labor True labor  Contractions last 30-70 seconds.  Contractions become very regular.  Discomfort is usually felt in the top of the uterus, and it spreads to the lower abdomen and low back.  Contractions do not go away with walking.  Contractions usually become more intense and increase in frequency.  The cervix dilates and gets thinner. False labor  Contractions are usually shorter and not as strong as true labor contractions.  Contractions are usually irregular.  Contractions  are often felt in the front of the lower abdomen and in the groin.  Contractions may go away when you walk around or change positions while lying down.  Contractions get weaker and are shorter-lasting as time goes on.  The cervix usually does not dilate or become thin. Follow these instructions at home:   Take over-the-counter and prescription medicines only as told by your health care provider.  Keep up with your usual exercises and follow other instructions from your health care provider.  Eat and drink lightly if you think you are going into labor.  If Braxton Hicks contractions are making you uncomfortable: ? Change your position from lying down or resting to walking, or change from walking to resting. ? Sit and rest in a tub of warm water. ? Drink enough fluid to keep your urine pale yellow. Dehydration may cause these contractions. ? Do slow and deep breathing several times an hour.  Keep all follow-up prenatal visits as told by your health care provider. This is important. Contact a health care provider if:  You have a fever.  You have continuous pain in your abdomen. Get help right away if:  Your contractions become stronger, more regular, and closer together.  You have fluid leaking or gushing from your vagina.  You pass blood-tinged mucus (bloody show).  You have bleeding from your vagina.  You have low back pain that you never had before.  You feel your baby's head pushing down and causing pelvic pressure.  Your baby is not moving inside you as much as it used to. Summary  Contractions that occur before labor are   called Braxton Hicks contractions, false labor, or practice contractions.  Braxton Hicks contractions are usually shorter, weaker, farther apart, and less regular than true labor contractions. True labor contractions usually become progressively stronger and regular, and they become more frequent.  Manage discomfort from Braxton Hicks contractions  by changing position, resting in a warm bath, drinking plenty of water, or practicing deep breathing. This information is not intended to replace advice given to you by your health care provider. Make sure you discuss any questions you have with your health care provider. Document Released: 11/17/2016 Document Revised: 04/18/2017 Document Reviewed: 11/17/2016 Elsevier Interactive Patient Education  2019 [employer]vier Inc[employer]  

## 2018-09-25 NOTE — MAU Provider Note (Signed)
Obstetric Attending MAU Note  Chief Complaint:  Abdominal Pain and Back Pain   First Provider Initiated Contact with Patient 09/25/18 1822     HPI: Amanda Campos is a 28 y.o. G2P0101 at [redacted]w[redacted]d who presents to maternity admissions reporting cramping.On ASA. 1 wk h/o cramping. No h/o PTB, delivered at 34 wks due to pre-eclampsia. Cramping improved with lying down. Starts in middle and then spreads out. Worse with movement. Pain travels and shoots to back or to anus. Feels like period cramps. Also having diarrhea with food. Some cramping associated with diarrhea. Notes that her stools are clear and possibly with mucous. Denies contractions, leakage of fluid or vaginal bleeding. Good fetal movement.   Pregnancy Course: Receives care at [facility] Patient Active Problem List   Diagnosis Date Noted  . Late prenatal care 07/03/2012  . Hypertension complicating pregnancy 07/03/2012    Past Medical History:  Diagnosis Date  . Infection 2011   kidney infection, was treated  . Pregnancy induced hypertension   . UTI (lower urinary tract infection)     OB History  Gravida Para Term Preterm AB Living  2 1 0 1 0 1  SAB TAB Ectopic Multiple Live Births  0 0 0 0 1    # Outcome Date GA Lbr Len/2nd Weight Sex Delivery Anes PTL Lv  2 Current           1 Preterm 07/04/12 [redacted]w[redacted]d  2376 g F CS-LTranv Spinal  LIV     Birth Comments: Normal hearing screen Needs repeat newborn screen after November 05, 2012 (transfused in NICU)  Newborn Screen: CAH: Normal Galactosemia: Unsat Galt: unsat Galactose 5.2 Normal Thyroid: Normal Hemoglobin: Transfused Please retest four months after the last transfusion date (repeated on 06/10/2013) CF: Normal Amino Acid Profile: Normal Acylcarnitine: Normal    Past Surgical History:  Procedure Laterality Date  . CESAREAN SECTION  07/04/2012   Procedure: CESAREAN SECTION;  Surgeon: Kathreen Cosier, MD;  Location: [facility];  Service: Obstetrics;  Laterality:  N/A;  . GANGLION CYST EXCISION    . TONSILLECTOMY      Family History: Family History  Problem Relation Age of Onset  . Cancer Maternal Grandmother     Social History: Social History   Tobacco Use  . Smoking status: Never Smoker  . Smokeless tobacco: Never Used  Substance Use Topics  . Alcohol use: Not Currently    Comment: socially  . Drug use: No    Allergies:  Allergies  Allergen Reactions  . Kiwi Extract Itching    Medications Prior to Admission  Medication Sig Dispense Refill Last Dose  . aspirin 81 MG chewable tablet Chew 81 mg by mouth daily.   09/25/2018 at Unknown time  . levothyroxine (SYNTHROID, LEVOTHROID) 50 MCG tablet Take 50 mcg by mouth daily before breakfast.   09/25/2018 at Unknown time  . Prenatal Vit-Fe Fumarate-FA (PRENATAL MULTIVITAMIN) TABS tablet Take 1 tablet by mouth daily at 12 noon.   09/24/2018 at Unknown time    ROS: Pertinent findings in history of present illness.  Physical Exam  Blood pressure 128/84, pulse 98, temperature 98.4 F (36.9 C), temperature source Oral, resp. rate 18, weight 108.9 kg, last menstrual period 12/17/2017, SpO2 100 %. CONSTITUTIONAL: Well-developed, well-nourished female in no acute distress.  HENT:  Normocephalic, atraumatic, External right and left ear normal. EYES: Conjunctivae and EOM are normal.  No scleral icterus.  NECK: Normal range of motion, supple, no masses SKIN: Skin is warm and dry.  No rash noted. Not diaphoretic. NEUROLGIC: Alert and oriented to person, place, and time.  PSYCHIATRIC: Normal mood and affect. Normal behavior.  CARDIOVASCULAR: Normal heart rate noted, regular rhythm RESPIRATORY: Effort and breath sounds normal ABDOMEN: Soft, nontender, nondistended, gravid appropriate for gestational age MUSCULOSKELETAL: Normal range of motion. No edema and no tenderness. 2+ distal pulses. GU: cervix closed/ anterior/50% effaced    FHT:  Baseline 145 , moderate variability, accelerations present,  no decelerations Contractions: irregular     Assessment: 1. Threatened premature labor in third trimester   Cervix without significant change 2. Diarrhea - unclear etiology--if with mucous as she says may need GI referral--will defer to primary MD.  Plan: Discharge home Preterm Labor precautions and fetal kick counts reviewed Follow up with OB provider  Follow-up Information    Ob/Gyn, [facility] Follow up.   Contact information: [address] [place] [address] [phone]           Allergies as of 09/25/2018      Reactions   Kiwi Extract Itching      Medication List    TAKE these medications   aspirin 81 MG chewable tablet Chew 81 mg by mouth daily.   levothyroxine 50 MCG tablet Commonly known as:  SYNTHROID, LEVOTHROID Take 50 mcg by mouth daily before breakfast.   prenatal multivitamin Tabs tablet Take 1 tablet by mouth daily at 12 noon.       Reva Bores, MD 09/25/2018 6:41 PM

## 2018-09-25 NOTE — MAU Note (Signed)
Been having cramps since Monday.  Usually in the front, usually by the end of the day it will go around to the back.  Every time she eats, within a short amount time, she has diarrhea- that is "gelantanous".

## 2018-10-03 DIAGNOSIS — Z369 Encounter for antenatal screening, unspecified: Secondary | ICD-10-CM | POA: Diagnosis not present

## 2018-10-04 ENCOUNTER — Other Ambulatory Visit: Payer: Self-pay | Admitting: Obstetrics and Gynecology

## 2018-10-30 DIAGNOSIS — Z369 Encounter for antenatal screening, unspecified: Secondary | ICD-10-CM | POA: Diagnosis not present

## 2018-10-30 DIAGNOSIS — E039 Hypothyroidism, unspecified: Secondary | ICD-10-CM | POA: Diagnosis not present

## 2018-11-14 ENCOUNTER — Telehealth (HOSPITAL_COMMUNITY): Payer: Self-pay | Admitting: *Deleted

## 2018-11-14 DIAGNOSIS — Z3480 Encounter for supervision of other normal pregnancy, unspecified trimester: Secondary | ICD-10-CM | POA: Diagnosis not present

## 2018-11-14 DIAGNOSIS — Z369 Encounter for antenatal screening, unspecified: Secondary | ICD-10-CM | POA: Diagnosis not present

## 2018-11-14 NOTE — Telephone Encounter (Signed)
Preadmission screen  

## 2018-11-15 ENCOUNTER — Telehealth (HOSPITAL_COMMUNITY): Payer: Self-pay | Admitting: *Deleted

## 2018-11-15 NOTE — Patient Instructions (Signed)
[name]  11/15/2018   Your procedure is scheduled on:  11/27/2018  Arrive at 1000 at Entrance C on [address] at [facility]  and [employer]. You are invited to use the FREE valet parking or use the Visitor's parking deck.  Pick up the phone at the desk and dial [phone].  Call this number if you have problems the morning of surgery: [phone]  Remember:   Do not eat food:(After Midnight) Desps de medianoche.  Do not drink clear liquids: (After Midnight) Desps de medianoche.  Take these medicines the morning of surgery with A SIP OF WATER:  levothyroxine   Do not wear jewelry, make-up or nail polish.  Do not wear lotions, powders, or perfumes. Do not wear deodorant.  Do not shave 48 hours prior to surgery.  Do not bring valuables to the hospital.  [facility] is not   responsible for any belongings or valuables brought to the hospital.  Contacts, dentures or bridgework may not be worn into surgery.  Leave suitcase in the car. After surgery it may be brought to your room.  For patients admitted to the hospital, checkout time is 11:00 AM the day of              discharge.      Please read over the following fact sheets that you were given:     Preparing for Surgery

## 2018-11-15 NOTE — Telephone Encounter (Signed)
Preadmission screen  

## 2018-11-16 ENCOUNTER — Telehealth (HOSPITAL_COMMUNITY): Payer: Self-pay | Admitting: *Deleted

## 2018-11-16 NOTE — Telephone Encounter (Signed)
Preadmission screen  

## 2018-11-20 ENCOUNTER — Encounter (HOSPITAL_COMMUNITY): Payer: Self-pay | Admitting: *Deleted

## 2018-11-20 NOTE — Patient Instructions (Signed)
MAITRI GUTKIN  11/20/2018   Your procedure is scheduled on:  11/27/2018  Arrive at 1000 at Entrance C on [address] at [facility]  and [employer]. You are invited to use the FREE valet parking or use the Visitor's parking deck.  Pick up the phone at the desk and dial [phone].  Call this number if you have problems the morning of surgery: [phone]  Remember:   Do not eat food:(After Midnight) Desps de medianoche.  Do not drink clear liquids: (After Midnight) Desps de medianoche.  Take these medicines the morning of surgery with A SIP OF WATER:  levothyroxine   Do not wear jewelry, make-up or nail polish.  Do not wear lotions, powders, or perfumes. Do not wear deodorant.  Do not shave 48 hours prior to surgery.  Do not bring valuables to the hospital.  [facility] is not   responsible for any belongings or valuables brought to the hospital.  Contacts, dentures or bridgework may not be worn into surgery.  Leave suitcase in the car. After surgery it may be brought to your room.  For patients admitted to the hospital, checkout time is 11:00 AM the day of              discharge.      Please read over the following fact sheets that you were given:     Preparing for Surgery

## 2018-11-21 DIAGNOSIS — Z369 Encounter for antenatal screening, unspecified: Secondary | ICD-10-CM | POA: Diagnosis not present

## 2018-11-22 ENCOUNTER — Encounter (HOSPITAL_COMMUNITY): Payer: Self-pay | Admitting: *Deleted

## 2018-11-22 ENCOUNTER — Other Ambulatory Visit: Payer: Self-pay

## 2018-11-22 ENCOUNTER — Inpatient Hospital Stay (HOSPITAL_COMMUNITY): Payer: BLUE CROSS/BLUE SHIELD | Admitting: Certified Registered Nurse Anesthetist

## 2018-11-22 ENCOUNTER — Inpatient Hospital Stay (HOSPITAL_COMMUNITY)
Admission: AD | Admit: 2018-11-22 | Discharge: 2018-11-25 | DRG: 788 | Disposition: A | Discharge status: Home / Self Care | Payer: BLUE CROSS/BLUE SHIELD | Attending: Obstetrics and Gynecology | Admitting: Obstetrics and Gynecology

## 2018-11-22 ENCOUNTER — Encounter (HOSPITAL_COMMUNITY)
Admission: AD | Disposition: A | Discharge status: Home / Self Care | Payer: Self-pay | Source: Home / Self Care | Attending: Obstetrics and Gynecology

## 2018-11-22 DIAGNOSIS — O99214 Obesity complicating childbirth: Secondary | ICD-10-CM | POA: Diagnosis present

## 2018-11-22 DIAGNOSIS — R030 Elevated blood-pressure reading, without diagnosis of hypertension: Secondary | ICD-10-CM | POA: Diagnosis not present

## 2018-11-22 DIAGNOSIS — O34211 Maternal care for low transverse scar from previous cesarean delivery: Secondary | ICD-10-CM | POA: Diagnosis not present

## 2018-11-22 DIAGNOSIS — E039 Hypothyroidism, unspecified: Secondary | ICD-10-CM | POA: Diagnosis not present

## 2018-11-22 DIAGNOSIS — O164 Unspecified maternal hypertension, complicating childbirth: Secondary | ICD-10-CM | POA: Diagnosis not present

## 2018-11-22 DIAGNOSIS — O99284 Endocrine, nutritional and metabolic diseases complicating childbirth: Secondary | ICD-10-CM | POA: Diagnosis not present

## 2018-11-22 DIAGNOSIS — O9902 Anemia complicating childbirth: Secondary | ICD-10-CM | POA: Diagnosis not present

## 2018-11-22 DIAGNOSIS — O26893 Other specified pregnancy related conditions, third trimester: Secondary | ICD-10-CM | POA: Diagnosis not present

## 2018-11-22 DIAGNOSIS — D649 Anemia, unspecified: Secondary | ICD-10-CM | POA: Diagnosis not present

## 2018-11-22 DIAGNOSIS — H538 Other visual disturbances: Secondary | ICD-10-CM | POA: Diagnosis not present

## 2018-11-22 DIAGNOSIS — O9089 Other complications of the puerperium, not elsewhere classified: Secondary | ICD-10-CM | POA: Diagnosis not present

## 2018-11-22 DIAGNOSIS — Z3A38 38 weeks gestation of pregnancy: Secondary | ICD-10-CM | POA: Diagnosis not present

## 2018-11-22 LAB — CBC
HCT: 30 % — ABNORMAL LOW (ref 36.0–46.0)
Hemoglobin: 8.6 g/dL — ABNORMAL LOW (ref 12.0–15.0)
MCH: 19.5 pg — ABNORMAL LOW (ref 26.0–34.0)
MCHC: 28.7 g/dL — ABNORMAL LOW (ref 30.0–36.0)
MCV: 68 fL — ABNORMAL LOW (ref 80.0–100.0)
Platelets: 226 10*3/uL (ref 150–400)
RBC: 4.41 MIL/uL (ref 3.87–5.11)
RDW: 18.7 % — ABNORMAL HIGH (ref 11.5–15.5)
WBC: 7 10*3/uL (ref 4.0–10.5)
nRBC: 0 % (ref 0.0–0.2)

## 2018-11-22 LAB — URINALYSIS, ROUTINE W REFLEX MICROSCOPIC
Bilirubin Urine: NEGATIVE
Glucose, UA: NEGATIVE mg/dL
Ketones, ur: 20 mg/dL — AB
Leukocytes,Ua: NEGATIVE
Nitrite: NEGATIVE
Protein, ur: 100 mg/dL — AB
RBC / HPF: >50 RBC/hpf — ABNORMAL HIGH (ref 0–5)
Specific Gravity, Urine: 1.015 (ref 1.005–1.030)
pH: 6 (ref 5.0–8.0)

## 2018-11-22 LAB — COMPREHENSIVE METABOLIC PANEL
ALT: 13 U/L (ref 0–44)
AST: 20 U/L (ref 15–41)
Albumin: 2.4 g/dL — ABNORMAL LOW (ref 3.5–5.0)
Alkaline Phosphatase: 168 U/L — ABNORMAL HIGH (ref 38–126)
Anion gap: 9 (ref 5–15)
BUN: <5 mg/dL — ABNORMAL LOW (ref 6–20)
CO2: 22 mmol/L (ref 22–32)
Calcium: 8.8 mg/dL — ABNORMAL LOW (ref 8.9–10.3)
Chloride: 108 mmol/L (ref 98–111)
Creatinine, Ser: 0.68 mg/dL (ref 0.44–1.00)
GFR calc Af Amer: >60 mL/min (ref >60)
GFR calc non Af Amer: >60 mL/min (ref >60)
Glucose, Bld: 80 mg/dL (ref 70–99)
Potassium: 4.1 mmol/L (ref 3.5–5.1)
Sodium: 139 mmol/L (ref 135–145)
Total Bilirubin: 0.3 mg/dL (ref 0.3–1.2)
Total Protein: 5.5 g/dL — ABNORMAL LOW (ref 6.5–8.1)

## 2018-11-22 LAB — TYPE AND SCREEN
ABO/RH(D): O NEG
Antibody Screen: NEGATIVE

## 2018-11-22 LAB — ABO/RH: ABO/RH(D): O NEG

## 2018-11-22 SURGERY — Surgical Case
Anesthesia: Spinal

## 2018-11-22 MED ORDER — PHENYLEPHRINE HCL-NACL 20-0.9 MG/250ML-% IV SOLN
INTRAVENOUS | Status: AC
  Filled 2018-11-22: qty 250

## 2018-11-22 MED ORDER — DEXAMETHASONE SODIUM PHOSPHATE 10 MG/ML IJ SOLN
INTRAMUSCULAR | Status: DC | PRN
  Administered 2018-11-22: 5 mg via INTRAVENOUS

## 2018-11-22 MED ORDER — FAMOTIDINE IN NACL 20-0.9 MG/50ML-% IV SOLN
20.0000 mg | INTRAVENOUS | Status: AC
  Administered 2018-11-22: 20 mg via INTRAVENOUS
  Filled 2018-11-22 (×2): qty 50

## 2018-11-22 MED ORDER — CEFAZOLIN SODIUM-DEXTROSE 2-4 GM/100ML-% IV SOLN
INTRAVENOUS | Status: AC
  Filled 2018-11-22: qty 100

## 2018-11-22 MED ORDER — PHENYLEPHRINE 40 MCG/ML (10ML) SYRINGE FOR IV PUSH (FOR BLOOD PRESSURE SUPPORT)
PREFILLED_SYRINGE | INTRAVENOUS | Status: DC | PRN
  Administered 2018-11-22: 80 ug via INTRAVENOUS

## 2018-11-22 MED ORDER — SODIUM CHLORIDE 0.9 % IV SOLN
INTRAVENOUS | Status: DC | PRN
  Administered 2018-11-22: 60 ug/min via INTRAVENOUS

## 2018-11-22 MED ORDER — MORPHINE SULFATE (PF) 0.5 MG/ML IJ SOLN
INTRAMUSCULAR | Status: AC
  Filled 2018-11-22: qty 10

## 2018-11-22 MED ORDER — MORPHINE SULFATE (PF) 0.5 MG/ML IJ SOLN
INTRAMUSCULAR | Status: DC | PRN
  Administered 2018-11-22: .15 ug via INTRATHECAL

## 2018-11-22 MED ORDER — OXYTOCIN 40 UNITS IN NORMAL SALINE INFUSION - SIMPLE MED
INTRAVENOUS | Status: AC
  Filled 2018-11-22: qty 1000

## 2018-11-22 MED ORDER — CEFAZOLIN SODIUM-DEXTROSE 2-4 GM/100ML-% IV SOLN
2.0000 g | INTRAVENOUS | Status: AC
  Administered 2018-11-22: 2 g via INTRAVENOUS
  Filled 2018-11-22: qty 100

## 2018-11-22 MED ORDER — DEXAMETHASONE SODIUM PHOSPHATE 10 MG/ML IJ SOLN
INTRAMUSCULAR | Status: AC
  Filled 2018-11-22: qty 1

## 2018-11-22 MED ORDER — FENTANYL CITRATE (PF) 100 MCG/2ML IJ SOLN
INTRAMUSCULAR | Status: AC
  Filled 2018-11-22: qty 2

## 2018-11-22 MED ORDER — LACTATED RINGERS IV SOLN
INTRAVENOUS | Status: DC | PRN
  Administered 2018-11-22 (×2): via INTRAVENOUS

## 2018-11-22 MED ORDER — BUPIVACAINE IN DEXTROSE 0.75-8.25 % IT SOLN
INTRATHECAL | Status: DC | PRN
  Administered 2018-11-22: 1.6 mL via INTRATHECAL

## 2018-11-22 MED ORDER — FENTANYL CITRATE (PF) 100 MCG/2ML IJ SOLN
INTRAMUSCULAR | Status: DC | PRN
  Administered 2018-11-22: 15 ug via INTRATHECAL

## 2018-11-22 MED ORDER — SODIUM CHLORIDE 0.9 % IR SOLN
Status: DC | PRN
  Administered 2018-11-22: 1000 mL

## 2018-11-22 MED ORDER — OXYTOCIN 40 UNITS IN NORMAL SALINE INFUSION - SIMPLE MED
INTRAVENOUS | Status: DC | PRN
  Administered 2018-11-22: 40 mL via INTRAVENOUS

## 2018-11-22 MED ORDER — EPHEDRINE 5 MG/ML INJ
INTRAVENOUS | Status: AC
  Filled 2018-11-22: qty 30

## 2018-11-22 MED ORDER — ONDANSETRON HCL 4 MG/2ML IJ SOLN
INTRAMUSCULAR | Status: AC
  Filled 2018-11-22: qty 2

## 2018-11-22 MED ORDER — SODIUM CHLORIDE 0.9 % IV SOLN
INTRAVENOUS | Status: DC | PRN
  Administered 2018-11-22: via INTRAVENOUS

## 2018-11-22 MED ORDER — LIDOCAINE HCL 1 % IJ SOLN
INTRAMUSCULAR | Status: AC
  Filled 2018-11-22: qty 20

## 2018-11-22 MED ORDER — ONDANSETRON HCL 4 MG/2ML IJ SOLN
INTRAMUSCULAR | Status: DC | PRN
  Administered 2018-11-22: 4 mg via INTRAVENOUS

## 2018-11-22 MED ORDER — LACTATED RINGERS IV SOLN
INTRAVENOUS | Status: DC
  Administered 2018-11-22: 22:00:00 via INTRAVENOUS

## 2018-11-22 MED ORDER — SOD CITRATE-CITRIC ACID 500-334 MG/5ML PO SOLN
30.0000 mL | Freq: Once | ORAL | Status: AC
  Administered 2018-11-22: 30 mL via ORAL
  Filled 2018-11-22: qty 30

## 2018-11-22 SURGICAL SUPPLY — 33 items
CHLORAPREP W/TINT 26 (MISCELLANEOUS) ×3 IMPLANT
CLAMP CORD UMBIL (MISCELLANEOUS) IMPLANT
CLOTH BEACON ORANGE TIMEOUT ST (SAFETY) ×3 IMPLANT
DERMABOND ADVANCED (GAUZE/BANDAGES/DRESSINGS) ×2
DERMABOND ADVANCED .7 DNX12 (GAUZE/BANDAGES/DRESSINGS) ×1 IMPLANT
DRSG OPSITE POSTOP 4X10 (GAUZE/BANDAGES/DRESSINGS) ×3 IMPLANT
ELECT REM PT RETURN 9FT ADLT (ELECTROSURGICAL) ×3
ELECTRODE REM PT RTRN 9FT ADLT (ELECTROSURGICAL) ×1 IMPLANT
EXTRACTOR VACUUM M CUP 4 TUBE (SUCTIONS) IMPLANT
EXTRACTOR VACUUM M CUP 4' TUBE (SUCTIONS)
GLOVE BIO SURGEON STRL SZ7 (GLOVE) ×3 IMPLANT
GLOVE BIOGEL PI IND STRL 7.0 (GLOVE) ×1 IMPLANT
GLOVE BIOGEL PI INDICATOR 7.0 (GLOVE) ×2
GOWN STRL REUS W/TWL LRG LVL3 (GOWN DISPOSABLE) ×6 IMPLANT
KIT ABG SYR 3ML LUER SLIP (SYRINGE) IMPLANT
NEEDLE HYPO 22GX1.5 SAFETY (NEEDLE) IMPLANT
NEEDLE HYPO 25X5/8 SAFETYGLIDE (NEEDLE) IMPLANT
NS IRRIG 1000ML POUR BTL (IV SOLUTION) ×3 IMPLANT
PACK C SECTION WH (CUSTOM PROCEDURE TRAY) ×3 IMPLANT
PAD OB MATERNITY 4.3X12.25 (PERSONAL CARE ITEMS) ×3 IMPLANT
PENCIL SMOKE EVAC W/HOLSTER (ELECTROSURGICAL) ×3 IMPLANT
RTRCTR C-SECT PINK 25CM LRG (MISCELLANEOUS) ×3 IMPLANT
SUT CHROMIC 1 CTX 36 (SUTURE) ×6 IMPLANT
SUT CHROMIC 2 0 CT 1 (SUTURE) ×3 IMPLANT
SUT PDS AB 0 CTX 60 (SUTURE) ×3 IMPLANT
SUT PLAIN 2 0 XLH (SUTURE) ×3 IMPLANT
SUT VIC AB 2-0 CT1 27 (SUTURE) ×2
SUT VIC AB 2-0 CT1 TAPERPNT 27 (SUTURE) ×1 IMPLANT
SUT VIC AB 4-0 KS 27 (SUTURE) IMPLANT
SYR 30ML LL (SYRINGE) IMPLANT
TOWEL OR 17X24 6PK STRL BLUE (TOWEL DISPOSABLE) ×3 IMPLANT
TRAY FOLEY W/BAG SLVR 14FR LF (SET/KITS/TRAYS/PACK) ×3 IMPLANT
WATER STERILE IRR 1000ML POUR (IV SOLUTION) ×6 IMPLANT

## 2018-11-22 NOTE — Anesthesia Procedure Notes (Addendum)
Spinal  Patient location during procedure: OR Start time: 11/22/2018 11:08 PM End time: 11/22/2018 11:18 PM Staffing Anesthesiologist: Heather Roberts, MD Performed: anesthesiologist  Preanesthetic Checklist Completed: patient identified, surgical consent, pre-op evaluation, timeout performed, IV checked, risks and benefits discussed and monitors and equipment checked Spinal Block Patient position: sitting Prep: DuraPrep Patient monitoring: cardiac monitor, continuous pulse ox and blood pressure Approach: midline Location: L2-3 Injection technique: single-shot Needle Needle type: Pencan  Needle gauge: 24 G Needle length: 9 cm Additional Notes Functioning IV was confirmed and monitors were applied. Sterile prep and drape, including hand hygiene and sterile gloves were used. The patient was positioned and the spine was prepped. The skin was anesthetized with lidocaine.  Free flow of clear CSF was obtained prior to injecting local anesthetic into the CSF.  The spinal needle aspirated freely following injection.  The needle was carefully withdrawn.  The patient tolerated the procedure well. Difficult placement.

## 2018-11-22 NOTE — MAU Note (Signed)
PT SAYS SROM   AT 800 PM -  - TO B-ROOM - THEN FLUID CAME OUT.   [facility]  C/S ON 5-12- REPEAT.  DENIES HSV AND MRSA.   PNC WITH DR ROSS

## 2018-11-22 NOTE — Anesthesia Preprocedure Evaluation (Signed)
Anesthesia Evaluation  Patient identified by MRN, date of birth, ID band Patient awake    Reviewed: Allergy & Precautions, NPO status , Patient's Chart, lab work & pertinent test results  Airway Mallampati: II  TM Distance: >3 FB Neck ROM: Full    Dental no notable dental hx. (+) Dental Advisory Given   Pulmonary neg pulmonary ROS,    Pulmonary exam normal        Cardiovascular hypertension, Normal cardiovascular exam     Neuro/Psych negative neurological ROS  negative psych ROS   GI/Hepatic negative GI ROS, Neg liver ROS,   Endo/Other  Hypothyroidism Morbid obesity  Renal/GU negative Renal ROS  negative genitourinary   Musculoskeletal negative musculoskeletal ROS (+)   Abdominal   Peds negative pediatric ROS (+)  Hematology negative hematology ROS (+)   Anesthesia Other Findings   Reproductive/Obstetrics (+) Pregnancy                             Anesthesia Physical Anesthesia Plan  ASA: III  Anesthesia Plan: Spinal   Post-op Pain Management:    Induction:   PONV Risk Score and Plan:   Airway Management Planned: Natural Airway and Simple Face Mask  Additional Equipment:   Intra-op Plan:   Post-operative Plan:   Informed Consent: I have reviewed the patients History and Physical, chart, labs and discussed the procedure including the risks, benefits and alternatives for the proposed anesthesia with the patient or authorized representative who has indicated his/her understanding and acceptance.     Dental advisory given  Plan Discussed with: CRNA and Anesthesiologist  Anesthesia Plan Comments:         Anesthesia Quick Evaluation

## 2018-11-22 NOTE — H&P (Signed)
Amanda Campos is a 28 y.o. female presenting for leaking fluid  28 yo G2P0101 @ 38+6 presents to MAU complaining of leaking fluid and was confirmed to have spontaneously ruptured membranes. She has a history of a prior cesarean section and she desires repeat. She has a history of pre-eclampsia with her first pregnancy necessitating preterm delivery via cesarean section for breech. Her pregnancy has been complicated by hypothyroidism diagnosed during the pregnancy and anemia.   OB History    Gravida  2   Para  1   Term  0   Preterm  1   AB  0   Living  1     SAB  0   TAB  0   Ectopic  0   Multiple  0   Live Births  1          Past Medical History:  Diagnosis Date  . Infection 2011   kidney infection, was treated  . Pregnancy induced hypertension   . UTI (lower urinary tract infection)    Past Surgical History:  Procedure Laterality Date  . CESAREAN SECTION  07/04/2012   Procedure: CESAREAN SECTION;  Surgeon: Kathreen Cosier, MD;  Location: [facility];  Service: Obstetrics;  Laterality: N/A;  . GANGLION CYST EXCISION    . TONSILLECTOMY     Family History: family history includes Cancer in her maternal grandmother. Social History:  reports that she has never smoked. She has never used smokeless tobacco. She reports previous alcohol use. She reports that she does not use drugs.     Maternal Diabetes: No Genetic Screening: Declined Maternal Ultrasounds/Referrals: Normal Fetal Ultrasounds or other Referrals:  None Maternal Substance Abuse:  No Significant Maternal Medications:  None Significant Maternal Lab Results:  None Other Comments:  None  ROS History Dilation: 1.5 Effacement (%): 70, 80 Station: -2 Exam by:: T Lytle RN  Blood pressure (!) 137/98, pulse 89, last menstrual period 12/17/2017. Exam Physical Exam  Prenatal labs: ABO, Rh: O/Negative/-- (11/11 0000) Antibody: Negative (11/11 0000) Rubella: Immune (11/11 0000) RPR: Nonreactive (11/11  0000)  HBsAg: Negative (11/11 0000)  HIV: Non-reactive (11/11 0000)  GBS:     Assessment/Plan: 1) Admit 2) Proceed with repeat cesarean section  3) SCDs  4) Ancef OCTOR   Waynard Reeds 11/22/2018, 10:25 PM

## 2018-11-23 ENCOUNTER — Encounter (HOSPITAL_COMMUNITY): Payer: Self-pay | Admitting: Obstetrics and Gynecology

## 2018-11-23 ENCOUNTER — Other Ambulatory Visit (HOSPITAL_COMMUNITY)
Admission: RE | Admit: 2018-11-23 | Discharge: 2018-11-23 | Disposition: A | Discharge status: Home / Self Care | Payer: BLUE CROSS/BLUE SHIELD | Source: Ambulatory Visit | Attending: Obstetrics and Gynecology | Admitting: Obstetrics and Gynecology

## 2018-11-23 LAB — CBC
HCT: 29.5 % — ABNORMAL LOW (ref 36.0–46.0)
Hemoglobin: 8.6 g/dL — ABNORMAL LOW (ref 12.0–15.0)
MCH: 19.5 pg — ABNORMAL LOW (ref 26.0–34.0)
MCHC: 29.2 g/dL — ABNORMAL LOW (ref 30.0–36.0)
MCV: 67 fL — ABNORMAL LOW (ref 80.0–100.0)
Platelets: 223 10*3/uL (ref 150–400)
RBC: 4.4 MIL/uL (ref 3.87–5.11)
RDW: 18.7 % — ABNORMAL HIGH (ref 11.5–15.5)
WBC: 13.7 10*3/uL — ABNORMAL HIGH (ref 4.0–10.5)
nRBC: 0 % (ref 0.0–0.2)

## 2018-11-23 LAB — RPR: RPR Ser Ql: NONREACTIVE

## 2018-11-23 MED ORDER — PRENATAL MULTIVITAMIN CH
1.0000 | ORAL_TABLET | Freq: Every day | ORAL | Status: DC
  Administered 2018-11-23 – 2018-11-24 (×2): 1 via ORAL
  Filled 2018-11-23 (×2): qty 1

## 2018-11-23 MED ORDER — DIPHENHYDRAMINE HCL 25 MG PO CAPS: 25.0000 mg | ORAL_CAPSULE | Freq: Four times a day (QID) | ORAL | Status: DC | PRN

## 2018-11-23 MED ORDER — ACETAMINOPHEN 325 MG PO TABS: 650.0000 mg | ORAL_TABLET | ORAL | Status: DC | PRN

## 2018-11-23 MED ORDER — SENNOSIDES-DOCUSATE SODIUM 8.6-50 MG PO TABS
2.0000 | ORAL_TABLET | ORAL | Status: DC
  Administered 2018-11-24 (×2): 2 via ORAL
  Filled 2018-11-23 (×2): qty 2

## 2018-11-23 MED ORDER — DIPHENHYDRAMINE HCL 25 MG PO CAPS: 25.0000 mg | ORAL_CAPSULE | ORAL | Status: DC | PRN

## 2018-11-23 MED ORDER — SIMETHICONE 80 MG PO CHEW
80.0000 mg | CHEWABLE_TABLET | ORAL | Status: DC | PRN
  Administered 2018-11-24: 80 mg via ORAL
  Filled 2018-11-23: qty 1

## 2018-11-23 MED ORDER — LACTATED RINGERS IV BOLUS
500.0000 mL | Freq: Once | INTRAVENOUS | Status: AC
  Administered 2018-11-23: 500 mL via INTRAVENOUS

## 2018-11-23 MED ORDER — SODIUM CHLORIDE 0.9% FLUSH: 3.0000 mL | INTRAVENOUS | Status: DC | PRN

## 2018-11-23 MED ORDER — KETOROLAC TROMETHAMINE 30 MG/ML IJ SOLN
INTRAMUSCULAR | Status: AC
  Filled 2018-11-23: qty 1

## 2018-11-23 MED ORDER — NALBUPHINE HCL 10 MG/ML IJ SOLN: 5.0000 mg | Freq: Once | INTRAMUSCULAR | Status: DC | PRN

## 2018-11-23 MED ORDER — IBUPROFEN 600 MG PO TABS
600.0000 mg | ORAL_TABLET | Freq: Four times a day (QID) | ORAL | Status: DC | PRN
  Administered 2018-11-23 – 2018-11-25 (×6): 600 mg via ORAL
  Filled 2018-11-23 (×5): qty 1

## 2018-11-23 MED ORDER — PROMETHAZINE HCL 25 MG/ML IJ SOLN: 6.2500 mg | INTRAMUSCULAR | Status: DC | PRN

## 2018-11-23 MED ORDER — NALOXONE HCL 0.4 MG/ML IJ SOLN: 0.4000 mg | INTRAMUSCULAR | Status: DC | PRN

## 2018-11-23 MED ORDER — OXYCODONE-ACETAMINOPHEN 5-325 MG PO TABS
1.0000 | ORAL_TABLET | ORAL | Status: DC | PRN
  Administered 2018-11-23 – 2018-11-24 (×4): 2 via ORAL
  Filled 2018-11-23 (×4): qty 2

## 2018-11-23 MED ORDER — WITCH HAZEL-GLYCERIN EX PADS: 1.0000 "application " | MEDICATED_PAD | CUTANEOUS | Status: DC | PRN

## 2018-11-23 MED ORDER — SCOPOLAMINE 1 MG/3DAYS TD PT72
MEDICATED_PATCH | TRANSDERMAL | Status: AC
  Filled 2018-11-23: qty 1

## 2018-11-23 MED ORDER — FERROUS SULFATE 325 (65 FE) MG PO TABS
325.0000 mg | ORAL_TABLET | Freq: Two times a day (BID) | ORAL | Status: DC
  Administered 2018-11-23 – 2018-11-25 (×5): 325 mg via ORAL
  Filled 2018-11-23 (×5): qty 1

## 2018-11-23 MED ORDER — DIBUCAINE (PERIANAL) 1 % EX OINT: 1.0000 "application " | TOPICAL_OINTMENT | CUTANEOUS | Status: DC | PRN

## 2018-11-23 MED ORDER — MEPERIDINE HCL 25 MG/ML IJ SOLN: 6.2500 mg | INTRAMUSCULAR | Status: DC | PRN

## 2018-11-23 MED ORDER — SCOPOLAMINE 1 MG/3DAYS TD PT72
1.0000 | MEDICATED_PATCH | Freq: Once | TRANSDERMAL | Status: DC
  Administered 2018-11-23: 1.5 mg via TRANSDERMAL

## 2018-11-23 MED ORDER — NALOXONE HCL 4 MG/10ML IJ SOLN
1.0000 ug/kg/h | INTRAVENOUS | Status: DC | PRN
  Filled 2018-11-23: qty 5

## 2018-11-23 MED ORDER — DIPHENHYDRAMINE HCL 50 MG/ML IJ SOLN: 12.5000 mg | INTRAMUSCULAR | Status: DC | PRN

## 2018-11-23 MED ORDER — KETOROLAC TROMETHAMINE 30 MG/ML IJ SOLN
30.0000 mg | Freq: Once | INTRAMUSCULAR | Status: AC
  Administered 2018-11-23: 30 mg via INTRAVENOUS

## 2018-11-23 MED ORDER — MENTHOL 3 MG MT LOZG: 1.0000 | LOZENGE | OROMUCOSAL | Status: DC | PRN

## 2018-11-23 MED ORDER — LACTATED RINGERS IV SOLN
INTRAVENOUS | Status: DC
  Administered 2018-11-23 (×2): via INTRAVENOUS

## 2018-11-23 MED ORDER — COCONUT OIL OIL
1.0000 "application " | TOPICAL_OIL | Status: DC | PRN
  Administered 2018-11-25: 1 via TOPICAL

## 2018-11-23 MED ORDER — TETANUS-DIPHTH-ACELL PERTUSSIS 5-2.5-18.5 LF-MCG/0.5 IM SUSP: 0.5000 mL | Freq: Once | INTRAMUSCULAR | Status: DC

## 2018-11-23 MED ORDER — SIMETHICONE 80 MG PO CHEW
80.0000 mg | CHEWABLE_TABLET | Freq: Three times a day (TID) | ORAL | Status: DC
  Administered 2018-11-23 – 2018-11-25 (×7): 80 mg via ORAL
  Filled 2018-11-23 (×7): qty 1

## 2018-11-23 MED ORDER — SIMETHICONE 80 MG PO CHEW
80.0000 mg | CHEWABLE_TABLET | ORAL | Status: DC
  Administered 2018-11-24 (×2): 80 mg via ORAL
  Filled 2018-11-23 (×2): qty 1

## 2018-11-23 MED ORDER — NALBUPHINE HCL 10 MG/ML IJ SOLN: 5.0000 mg | INTRAMUSCULAR | Status: DC | PRN

## 2018-11-23 MED ORDER — OXYTOCIN 40 UNITS IN NORMAL SALINE INFUSION - SIMPLE MED: 2.5000 [IU]/h | INTRAVENOUS | Status: AC

## 2018-11-23 MED ORDER — LEVOTHYROXINE SODIUM 75 MCG PO TABS
75.0000 ug | ORAL_TABLET | Freq: Every day | ORAL | Status: DC
  Administered 2018-11-24 – 2018-11-25 (×2): 75 ug via ORAL
  Filled 2018-11-23 (×2): qty 1

## 2018-11-23 MED ORDER — ONDANSETRON HCL 4 MG/2ML IJ SOLN: 4.0000 mg | Freq: Three times a day (TID) | INTRAMUSCULAR | Status: DC | PRN

## 2018-11-23 MED ORDER — RHO D IMMUNE GLOBULIN 1500 UNIT/2ML IJ SOSY
300.0000 ug | PREFILLED_SYRINGE | Freq: Once | INTRAMUSCULAR | Status: AC
  Administered 2018-11-23: 300 ug via INTRAVENOUS
  Filled 2018-11-23: qty 2

## 2018-11-23 NOTE — Progress Notes (Signed)
Patient is eating, ambulating, foley in.  Pain control is good.  Appropriate lochia, no complaints, no flatus.    Vitals:   11/23/18 0317 11/23/18 0426 11/23/18 0540 11/23/18 0946  BP: 114/79 128/88 126/88   Pulse: 61 (!) 58 (!) 58   Resp: 18 18 18 20   Temp: 97.9 F (36.6 C) 97.8 F (36.6 C) 97.9 F (36.6 C) 97.8 F (36.6 C)  TempSrc: Oral Oral Oral   SpO2: 97% 99% 100% 100%    Fundus firm Abd: soft, nontender Inc: c/d/i Ext: no calf tenderness  Lab Results  Component Value Date   WBC 13.7 (H) 11/23/2018   HGB 8.6 (L) 11/23/2018   HCT 29.5 (L) 11/23/2018   MCV 67.0 (L) 11/23/2018   PLT 223 11/23/2018    --/--/O NEG (05/08 0558)  A/P Post op day #1 s/p repeat c/s Anemia Hb 8.6, added iron bid H/o preeclampia, BPs normal  Routine care.  Expect d/c 5/9.    Philip Aspen

## 2018-11-23 NOTE — Transfer of Care (Signed)
Immediate Anesthesia Transfer of Care Note  Patient: Amanda Campos  Procedure(s) Performed: CESAREAN SECTION (N/A )  Patient Location: PACU  Anesthesia Type:Spinal  Level of Consciousness: awake, alert  and oriented  Airway & Oxygen Therapy: Patient Spontanous Breathing  Post-op Assessment: Report given to RN and Post -op Vital signs reviewed and stable  Post vital signs: Reviewed and stable  Last Vitals:  Vitals Value Taken Time  BP 101/59 11/23/2018 12:41 AM  Temp    Pulse 75 11/23/2018 12:44 AM  Resp 22 11/23/2018 12:44 AM  SpO2 96 % 11/23/2018 12:44 AM  Vitals shown include unvalidated device data.  Last Pain:  Vitals:   11/22/18 2131  PainSc: 8          Complications: No apparent anesthesia complications

## 2018-11-23 NOTE — Anesthesia Postprocedure Evaluation (Signed)
Anesthesia Post Note  Patient: Amanda Campos  Procedure(s) Performed: CESAREAN SECTION (N/A )     Patient location during evaluation: PACU Anesthesia Type: Spinal Level of consciousness: awake and alert Pain management: pain level controlled Vital Signs Assessment: post-procedure vital signs reviewed and stable Respiratory status: spontaneous breathing and respiratory function stable Cardiovascular status: blood pressure returned to baseline and stable Postop Assessment: spinal receding Anesthetic complications: no    Last Vitals:  Vitals:   11/23/18 0100 11/23/18 0115  BP: 105/77 113/79  Pulse: 63 65  Resp: 19 20  Temp:  36.6 C  SpO2: 96% 96%    Last Pain:  Vitals:   11/23/18 0115  TempSrc: Oral  PainSc: 4    Pain Goal:    LLE Motor Response: Non-purposeful movement (11/23/18 0115) LLE Sensation: Tingling (11/23/18 0115) RLE Motor Response: Non-purposeful movement (11/23/18 0115) RLE Sensation: Tingling (11/23/18 0115)     Epidural/Spinal Function Cutaneous sensation: Able to Wiggle Toes (11/23/18 0115), Patient able to flex knees: Yes (11/23/18 0115), Patient able to lift hips off bed: No (11/23/18 0115), Back pain beyond tenderness at insertion site: No (11/23/18 0115), Progressively worsening motor and/or sensory loss: No (11/23/18 0115), Bowel and/or bladder incontinence post epidural: No (11/23/18 0115)  [name]

## 2018-11-23 NOTE — Op Note (Signed)
Pre-Operative Diagnosis: 1) 38+6 week intrauterine pregnancy 2) spontaneous rupture of membranes 3) history of prior cesarean, declines trial of labor Postoperative Diagnosis: 1) 38+6 week intrauterine pregnancy 2) spontaneous rupture of membranes 3) history of prior cesarean, declines trial of labor Procedure: Repeat low transverse cesarean section Surgeon: Dr. Waynard Reeds Assistant: None Operative Findings: Vigorous female infant in the vertex presentation with apgar scores of 9 at 1 minute and 9 at 5 minutes. Normal ovaries and tubes bilaterally.  Specimen: placenta for disposal EBL: Total I/O In: 1600 [I.V.:1600] Out: 130 [Blood:130]   Procedure:Ms. Amanda Campos is an 28 year old gravida 2 para 0101 at 72 weeks and 6 days estimated gestational age who presents for cesarean section. Following the appropriate informed consent the patient was brought to the operating room where spinal anesthesia was administered and found to be adequate. The patient was appropriately identified during a preoperative time out procedure. She was placed in the dorsal supine position with a leftward tilt. She was prepped and draped in the normal sterile fashion. Scalpel was then used to make a Pfannenstiel skin incision which was carried down to the underlying layers of soft tissue to the fascia. The fascia was incised in the midline and the fascial incision was extended laterally with Mayo scissors. The superior aspect of the fascial incision was grasped with Coker clamps x2, tented up and the rectus muscles dissected off sharply with the electrocautery unit area and the same procedure was repeated on the inferior aspect of the fascial incision. The rectus muscles were separated in the midline. The abdominal peritoneum was identified, tented up, entered sharply, and the incision was extended superiorly and inferiorly with good visualization of the bladder. The Alexis retractor was then deployed. The vesicouterine peritoneum was  identified, tented up, entered sharply, and the bladder flap was created digitally. Scalpel was then used to make a low transverse incision on the uterus which was extended laterally with blunt dissection. The fetal vertex was identified, delivered easily through the uterine incision followed by the body. The infant was bulb suctioned on the operative field cried vigorously, cord was clamped and cut and the infant was passed to the waiting neonatology team. Placenta was then delivered spontaneously, the uterus was cleared of all clot and debris. The uterine incision was repaired with #1 chromic in running locked fashion followed by a second imbricating layer. Ovaries and tubes were inspected and normal. The Alexis retractor was removed. The abdominal peritoneum was reapproximated with 2-0 Vicryl in a running fashion, the rectus muscles was reapproximated with 2-0 chromic in a running fashion. The fascia was closed with a looped PDS in a running fashion. The subcuticular tissue was reapproximated with 2-0 plain interrupted sutures.  The skin was closed with 4-0 vicryl in a subcuticular fashion and Dermabon. All sponge lap and needle counts were correct. Patient tolerated the procedure well and recovered in stable condition following the procedure.

## 2018-11-24 LAB — RH IG WORKUP (INCLUDES ABO/RH)
ABO/RH(D): O NEG
Fetal Screen: NEGATIVE
Gestational Age(Wks): 38
Unit division: 0

## 2018-11-24 NOTE — Progress Notes (Signed)
Patient is eating, ambulating, voiding.  Pain control is good.  Received call approx 6am from RN taking care of patient.  She alerted me to her mild range BP and states pt was complaining of blurred vision and SOB.  States pt was 97% sat on room air.  On my visit to patient she denies and CP or SOB, states she just felt dizzy up to bathroom without help and thinks she should have called for assistance, she reports continuing to have blurred vision, states her reading glasses haven't helped, but denies HA or RUQ pain or other problems.    Vitals:   11/23/18 1216 11/23/18 1633 11/23/18 2020 11/24/18 0530  BP: 123/71 126/81 125/74 (!) 145/90  Pulse: 67 80 76 75  Resp: 18 18 18 18   Temp: 98.4 F (36.9 C) 98.1 F (36.7 C) 98.6 F (37 C) 97.8 F (36.6 C)  TempSrc: Oral Oral Oral Oral  SpO2: 100%  98% 97%    Fundus firm Abd: soft, nontender Ext: no calf tenderness, SCDs in place and functioning  Lab Results  Component Value Date   WBC 13.7 (H) 11/23/2018   HGB 8.6 (L) 11/23/2018   HCT 29.5 (L) 11/23/2018   MCV 67.0 (L) 11/23/2018   PLT 223 11/23/2018    --/--/O NEG (05/08 0558)  A/P Post op day #2 s/p repeat c/s. Pt states she used incentive spirometer yesterday but not today, discussed routine use may help her expand lungs and lessen sensation of SOB, advised to call if SOB returned.  Encouraged ambulation and spiro use. Discussed blurred vision in isolation, may be d/t general swelling postpartum and anticipate that it will resolve as she loses fluid. Elevated BP x 1, h.o PEC- will monitor patient for at least 1 add'l day. Anemia, continue iron bid and PNV.  Routine care.    Philip Aspen

## 2018-11-24 NOTE — Progress Notes (Signed)
   11/24/18 0530  Vital Signs  BP (!) 145/90  BP Location Left Arm  Patient Position (if appropriate) Semi-fowlers  BP Method Automatic  Pulse Rate 75  Pulse Rate Source Dinamap  Resp 18  Temp 97.8 F (36.6 C)  Temp Source Oral  Complaints & Interventions  Complains of  (blurred vision)  POSS Scale (Pasero Opioid Sedation Scale)  POSS *See Group Information* 1-Acceptable,Awake and alert  Pt's BP elevated this AM. Pt  comlains of blurred vission and SOB when she gets up to the bathroom. Pt denies dizziness, headaches and upper epigastric pain.

## 2018-11-24 NOTE — Lactation Note (Signed)
This note was copied from a baby's chart. Lactation Consultation Note  Patient Name: Amanda Campos [id number] Date: 11/24/2018 Reason for consult: Initial assessment;Early term 37-38.6wks P2, 31 hour female infant. Per mom, infant started cluster feeding last night. Infant had 4 stools and 4 voids. Mom feels breastfeeding is going well, she breastfeed her 27 year old daughter for 6 months she stopped due pressure to formula feed from her Mother- in Social worker. Mom's goal is to breastfeed infant for two years. Mom latched infant on left breast using cross cradle hold, infant latched with wide mouth nose to breast, was still breastfeeding (10 minutes) when LC left the room. Mom knows to breastfeed according hunger cues, 8 or more times within 24 hours. Mom will continue to do as much STS as possible.  Mom knows to call Nurse or LC if she has any questions, concerns or need assistance with latching infant to breast. LC discussed I & O. Reviewed Baby & Me book's Breastfeeding Basics.  LC discussed [place] [facility]. Mom made aware of O/P services, breastfeeding support groups, community resources, and our phone # for post-discharge questions.   Maternal Data Formula Feeding for Exclusion: No Has patient been taught Hand Expression?: Yes  Feeding Feeding Type: Breast Fed  LATCH Score Latch: Grasps breast easily, tongue down, lips flanged, rhythmical sucking.  Audible Swallowing: A few with stimulation  Type of Nipple: Everted at rest and after stimulation  Comfort (Breast/Nipple): Soft / non-tender  Hold (Positioning): Assistance needed to correctly position infant at breast and maintain latch.  LATCH Score: 8  Interventions Interventions: Assisted with latch;Breast feeding basics reviewed;Skin to skin;Breast massage;Support pillows;Adjust position;Breast compression;Position options  Lactation Tools Discussed/Used WIC Program: No   Consult  Status Consult Status: Follow-up Date: 11/24/18 Follow-up type: In-patient    Danelle Earthly 11/24/2018, 6:53 AM

## 2018-11-25 ENCOUNTER — Ambulatory Visit: Payer: Self-pay

## 2018-11-25 MED ORDER — OXYCODONE-ACETAMINOPHEN 5-325 MG PO TABS: 1.0000 | ORAL_TABLET | ORAL | 0 refills | Status: DC | PRN

## 2018-11-25 NOTE — Lactation Note (Signed)
This note was copied from a baby's chart. Lactation Consultation Note  Patient Name: Boy Adilynne Gaumer [id number] Date: 11/25/2018  P2, 51 hour female infant, weight loss-6%. Per dad, infant had a lot of stools today more than 4.  Per mom, she feels breastfeeding is going good,  infant is latching well, Marvis Moeller is  breastfeeding for 30 to 40 minutes most feedings.  LC did not observe latch, mom last breastfeed at 0230 am hours prior to [facility] entering the room for 30 minutes. Dad is doing STS with infant who appears to be content and is not cuing to breastfeed at this time.  Mom is breastfeeding according hunger cues, 8 or more times within 24 hours. LC discussed with mom breast engorgement and treatment. LC reviewed with mom LC services:  O/P services, breastfeeding support groups, community resources, and our phone # for post-discharge questions.   Maternal Data    Feeding    LATCH Score                   Interventions    Lactation Tools Discussed/Used     Consult Status      Danelle Earthly 11/25/2018, 3:16 AM

## 2018-11-25 NOTE — Discharge Summary (Addendum)
Pt reports no CP/SOB, breast, GI or GU problems today.  Appropriate lochia and states ready for d/c home.  Obstetric Discharge Summary Reason for Admission: cesarean section and rupture of membranes Prenatal Procedures: ultrasound Intrapartum Procedures: cesarean: low cervical, transverse Postpartum Procedures: none Complications-Operative and Postpartum: none Hemoglobin  Date Value Ref Range Status  11/23/2018 8.6 (L) 12.0 - 15.0 g/dL Final    Comment:    Reticulocyte Hemoglobin testing may be clinically indicated, consider ordering this additional test [id number]    HCT  Date Value Ref Range Status  11/23/2018 29.5 (L) 36.0 - 46.0 % Final    Physical Exam:  General: alert and cooperative Lochia: appropriate Uterine Fundus: firm Incision: healing well, no significant drainage DVT Evaluation: No evidence of DVT seen on physical exam.  Discharge Diagnoses: Term Pregnancy-delivered  Discharge Information: Date: 11/25/2018 Activity: pelvic rest Diet: routine Medications: PNV, Ibuprofen and Percocet, iron bid Condition: stable Instructions: refer to practice specific booklet Discharge to: home Follow-up Information    Waynard Reeds, MD Follow up in 4 week(s).   Specialty:  Obstetrics and Gynecology Contact information: [address] [place] [address] [phone]           Newborn Data: Live born female  Birth Weight: 6 lb 5.8 oz (2885 g) APGAR: 8, 8  Newborn Delivery   Birth date/time:  11/22/2018 23:44:00 Delivery type:  C-Section, Low Transverse Trial of labor:  No C-section categorization:  Repeat     Home with mother.  Philip Aspen 11/25/2018, 8:30 AM

## 2018-11-25 NOTE — Lactation Note (Signed)
This note was copied from a baby's chart. Lactation Consultation Note  Patient Name: Boy Tehillah Provence [id number] Date: 11/25/2018  P2, 69 hour female infant, weight loss-9% and being supplemented with formula at this time. LC asked mom how many voids and stools did infant have today?. Mom unable to fully answer questions. Per mom, infant had 4 stools today she is unsure about wet diapers. LC discussed how to tell if infant has a wet diaper the diaper has a color indicator and it will turn blue when wet. Nurse present in room and suggest mom keep all diapers that she change and nurse can help Mom determine if they are wet. LC discussed importance of correct documentation on (yelllow or white sheet)  of infant's input and output levels and how it helps [place] know how infant is doing. Per mom, she pumped first time today almost 5 hours ago and pumped 30 ml of breast milk. Mom want LC to look at breast nipple due to hardness LC explained her breast are becoming full and her milk is started to transition from colostrum to mature milk. LC asked mom to pump and mom pumped 20 ml , LC ask mom to look at her milk , it is more transparent not as thick as the first day ,  more clearer and higher in volume. Mom had previously  breast feed infant, not time for feeding, infant is asleep in basinet. Mom been feeding infant on cues, 8 or more times within 24 hours, Mom and Dad has been doing a lot of STS. Mom knows to use DEBP every 3 hours for 15 minutes on initial setting. LC gave mom a  "Feeding Amounts sheet when Supplementing with Breastfeeding ", based on infant 's age/ hours of life, 48-72 hours with breastfeeding ( 18-25 ml ) and formula only (30-60 ml).  Mom plans to : 1. Mom will breastfeed infant, then supplement with her EBM that she pumps, if EBM is below required amounts for infant's age/ hours then she will supplement  the other amount with formula to meet requirement needed . 2. LC ask mom to teach back  her plan. 3. Mom will pump after breastfeeding and save EBM for other future feeding to supplement infant . 4. Mom knows to call Nurse or LC if she has questions, concerns or wants help with latching infant at breast. 5. Parents will continue with as much STS as possible.    Maternal Data    Feeding    LATCH Score                   Interventions    Lactation Tools Discussed/Used     Consult Status      Danelle Earthly 11/25/2018, 9:19 PM

## 2018-11-26 ENCOUNTER — Encounter (HOSPITAL_COMMUNITY)
Admission: RE | Admit: 2018-11-26 | Discharge: 2018-11-26 | Disposition: A | Discharge status: Home / Self Care | Payer: BLUE CROSS/BLUE SHIELD | Source: Ambulatory Visit

## 2018-11-26 ENCOUNTER — Ambulatory Visit: Payer: Self-pay

## 2018-11-26 NOTE — Lactation Note (Signed)
This note was copied from a baby's chart. Lactation Consultation Note  Patient Name: Boy Leyna Vanderkolk [id number] Date: 11/26/2018 Reason for consult: Follow-up assessment  I met briefly with parents. They have no complaints or questions. Mom has been putting infant to breast, pumping & then giving infant EBM (20-30 mL), and then giving formula, as needed.   Parents know to use bottles with slow-flow nipples at home. Matthias Hughs [facility] 11/26/2018, 8:54 AM

## 2018-11-27 ENCOUNTER — Inpatient Hospital Stay (HOSPITAL_COMMUNITY)
Admission: RE | Admit: 2018-11-27 | Payer: BLUE CROSS/BLUE SHIELD | Source: Home / Self Care | Admitting: Obstetrics and Gynecology

## 2019-02-14 DIAGNOSIS — Z3049 Encounter for surveillance of other contraceptives: Secondary | ICD-10-CM | POA: Diagnosis not present

## 2019-02-14 DIAGNOSIS — Z3202 Encounter for pregnancy test, result negative: Secondary | ICD-10-CM | POA: Diagnosis not present

## 2019-02-14 DIAGNOSIS — Z6841 Body Mass Index (BMI) 40.0 and over, adult: Secondary | ICD-10-CM | POA: Diagnosis not present

## 2019-04-17 ENCOUNTER — Encounter: Payer: Self-pay | Admitting: Adult Health Nurse Practitioner

## 2019-04-17 ENCOUNTER — Ambulatory Visit (INDEPENDENT_AMBULATORY_CARE_PROVIDER_SITE_OTHER): Payer: BC Managed Care – PPO | Admitting: Adult Health Nurse Practitioner

## 2019-04-17 ENCOUNTER — Other Ambulatory Visit: Payer: Self-pay

## 2019-04-17 DIAGNOSIS — R112 Nausea with vomiting, unspecified: Secondary | ICD-10-CM | POA: Insufficient documentation

## 2019-04-17 DIAGNOSIS — F32A Depression, unspecified: Secondary | ICD-10-CM | POA: Insufficient documentation

## 2019-04-17 DIAGNOSIS — F329 Major depressive disorder, single episode, unspecified: Secondary | ICD-10-CM | POA: Insufficient documentation

## 2019-04-17 DIAGNOSIS — F331 Major depressive disorder, recurrent, moderate: Secondary | ICD-10-CM

## 2019-04-17 HISTORY — DX: Nausea with vomiting, unspecified: R11.2

## 2019-04-17 HISTORY — DX: Depression, unspecified: F32.A

## 2019-04-17 MED ORDER — SERTRALINE HCL 50 MG PO TABS: 50.0000 mg | ORAL_TABLET | Freq: Every day | ORAL | 3 refills | Status: DC

## 2019-04-17 NOTE — Progress Notes (Signed)
New Patient Office Visit  Subjective:  Patient ID: Amanda Campos, female    DOB: [birth year]  Age: 28 y.o. MRN: [id number]  CC:  Chief Complaint  Patient presents with  . Hypertension  . Obesity    pt states she has lost 15 lbs in last 2 weeks    HPI Amanda Campos presents for weight loss and depression.   She presents for a two week hx of weight loss of 15 pounds.  Everytime she eats something, she vomits.  Feels somewhat nauseous.  She denies any viral illness or other mitigating factor.  No fevers, chills, or night sweats.  This is associated with loose tools.  She really has not been eating much because of the vomiting.  No hematemesis.   She also presents with depression.  This is a recurrent issue.  Reports depression from in her teens and a hx of 2 previous suicide attempts in highschool.  She notes and her sister has noted as well that the patient has become more depressed lately over the past few months.  She has a 4 month old and lives with boyfriend, their two children, and other family.  She is feeling hopeless but denies any suicide intent or plan.  She wants to put her child first and is trying to work things out with herself so that she is in better shape for her child.  She notes that she doesn't enjoy things anymore and is not able to sleep much.  Gets about 4 hours of sleep and has minimal help from boyfriend with her child.   PHQ 9=20  She does self medicate with THC.  Non-smoker.  Very minimal ETOH. < 1 drink per week.   Past Medical History:  Diagnosis Date  . Depression 04/17/2019  . Infection 2011   kidney infection, was treated  . Nausea with vomiting 04/17/2019  . Pregnancy induced hypertension   . UTI (lower urinary tract infection)     Past Surgical History:  Procedure Laterality Date  . CESAREAN SECTION  07/04/2012   Procedure: CESAREAN SECTION;  Surgeon: Kathreen Cosier, MD;  Location: [facility];  Service: Obstetrics;  Laterality: N/A;  . CESAREAN  SECTION N/A 11/22/2018   Procedure: CESAREAN SECTION;  Surgeon: Waynard Reeds, MD;  Location: [facility] ORS;  Service: Obstetrics;  Laterality: N/A;  Tracey RNFA  . GANGLION CYST EXCISION    . TONSILLECTOMY      Family History  Problem Relation Age of Onset  . Cancer Maternal Grandmother     Social History   Socioeconomic History  . Marital status: Single    Spouse name: Not on file  . Number of children: Not on file  . Years of education: Not on file  . Highest education level: Not on file  Occupational History  . Not on file  Social Needs  . Financial resource strain: Not hard at all  . Food insecurity    Worry: Never true    Inability: Never true  . Transportation needs    Medical: No    Non-medical: Not on file  Tobacco Use  . Smoking status: Never Smoker  . Smokeless tobacco: Never Used  Substance and Sexual Activity  . Alcohol use: Not Currently    Comment: socially  . Drug use: No  . Sexual activity: Yes    Birth control/protection: Condom  Lifestyle  . Physical activity    Days per week: Not on file    Minutes per session:  Not on file  . Stress: To some extent  Relationships  . Social Herbalist on phone: Not on file    Gets together: Not on file    Attends religious service: Not on file    Active member of club or organization: Not on file    Attends meetings of clubs or organizations: Not on file    Relationship status: Not on file  . Intimate partner violence    Fear of current or ex partner: No    Emotionally abused: No    Physically abused: No    Forced sexual activity: No  Other Topics Concern  . Not on file  Social History Narrative  . Not on file    ROS Review of Systems  Constitutional: Positive for appetite change, fatigue and unexpected weight change. Negative for chills, diaphoresis and fever.  Respiratory: Negative.   Cardiovascular: Negative.   Endocrine: Negative for cold intolerance and heat intolerance.  Genitourinary:  Negative for dysuria, flank pain and hematuria.  Musculoskeletal: Negative for arthralgias, joint swelling and myalgias.  Skin: Negative.   Allergic/Immunologic: Negative.   Neurological: Negative for dizziness, weakness and headaches.  Hematological: Negative.   Psychiatric/Behavioral: Positive for decreased concentration, dysphoric mood and sleep disturbance. Negative for agitation, behavioral problems and suicidal ideas. The patient is not nervous/anxious.     Objective:   Today's Vitals: There were no vitals taken for this visit.  Physical Exam HENT:     Head: Normocephalic and atraumatic.  Eyes:     Extraocular Movements: Extraocular movements intact.     Conjunctiva/sclera: Conjunctivae normal.     Pupils: Pupils are equal, round, and reactive to light.  Abdominal:     Palpations: Abdomen is soft.     Tenderness: There is abdominal tenderness. There is no rebound.     Comments: Mild tenderness in the umbilical region   Skin:    General: Skin is warm and dry.     Capillary Refill: Capillary refill takes less than 2 seconds.  Neurological:     General: No focal deficit present.     Mental Status: She is alert and oriented to person, place, and time.  Psychiatric:        Attention and Perception: Attention and perception normal.        Mood and Affect: Mood is depressed.        Speech: Speech normal.        Behavior: Behavior is cooperative.        Thought Content: Thought content normal.        Cognition and Memory: Cognition and memory normal.        Judgment: Judgment normal.     Assessment & Plan:   Problem List Items Addressed This Visit      Digestive   Nausea with vomiting (Chronic)   Relevant Orders   H. pylori breath test   CBC with Differential   COMPLETE METABOLIC PANEL WITH GFR   Thyroid Panel With TSH     Other   Depression (Chronic)   Relevant Medications   sertraline (ZOLOFT) 50 MG tablet   Other Relevant Orders   CBC with Differential    COMPLETE METABOLIC PANEL WITH GFR   Thyroid Panel With TSH      Outpatient Encounter Medications as of 04/17/2019  Medication Sig  . Prenatal Vit-Fe Fumarate-FA (PRENATAL MULTIVITAMIN) TABS tablet Take 1 tablet by mouth daily at 12 noon.  . [DISCONTINUED] oxyCODONE-acetaminophen (PERCOCET/ROXICET) 5-325 MG tablet  Take 1-2 tablets by mouth every 4 (four) hours as needed for moderate pain.  Marland Kitchen. levothyroxine (SYNTHROID) 75 MCG tablet Take 75 mcg by mouth daily before breakfast.   . sertraline (ZOLOFT) 50 MG tablet Take 1 tablet (50 mg total) by mouth daily.   No facility-administered encounter medications on file as of 04/17/2019.     Follow-up: Return in about 3 weeks (around 05/08/2019).  We discussed a virtual appointment.  Also discussed getting to see a counselor to discuss her feelings.  She was advised to call the National Suicide Hotline or to go to the nearest ER if she feels any suicidal ideation or intention.  She contracted for safety.  She will call if any concerns between now and f/u.   I spent over 30 minutes in patient care with the patient with more than half of the visit used for counseling, referrals and other discussions.   Elyse JarvisSarah A Dajour [place], NP

## 2019-04-17 NOTE — Patient Instructions (Signed)
Living With Depression Everyone experiences occasional disappointment, sadness, and loss in their lives. When you are feeling down, blue, or sad for at least 2 weeks in a row, it may mean that you have depression. Depression can affect your thoughts and feelings, relationships, daily activities, and physical health. It is caused by changes in the way your brain functions. If you receive a diagnosis of depression, your health care provider will tell you which type of depression you have and what treatment options are available to you. If you are living with depression, there are ways to help you recover from it and also ways to prevent it from coming back. How to cope with lifestyle changes Coping with stress     Stress is your body's reaction to life changes and events, both good and bad. Stressful situations may include:  Getting married.  The death of a spouse.  Losing a job.  Retiring.  Having a baby. Stress can last just a few hours or it can be ongoing. Stress can play a major role in depression, so it is important to learn both how to cope with stress and how to think about it differently. Talk with your health care provider or a counselor if you would like to learn more about stress reduction. He or she may suggest some stress reduction techniques, such as:  Music therapy. This can include creating music or listening to music. Choose music that you enjoy and that inspires you.  Mindfulness-based meditation. This kind of meditation can be done while sitting or walking. It involves being aware of your normal breaths, rather than trying to control your breathing.  Centering prayer. This is a kind of meditation that involves focusing on a spiritual word or phrase. Choose a word, phrase, or sacred image that is meaningful to you and that brings you peace.  Deep breathing. To do this, expand your stomach and inhale slowly through your nose. Hold your breath for 3-5 seconds, then exhale  slowly, allowing your stomach muscles to relax.  Muscle relaxation. This involves intentionally tensing muscles then relaxing them. Choose a stress reduction technique that fits your lifestyle and personality. Stress reduction techniques take time and practice to develop. Set aside 5-15 minutes a day to do them. Therapists can offer training in these techniques. The training may be covered by some insurance plans. Other things you can do to manage stress include:  Keeping a stress diary. This can help you learn what triggers your stress and ways to control your response.  Understanding what your limits are and saying no to requests or events that lead to a schedule that is too full.  Thinking about how you respond to certain situations. You may not be able to control everything, but you can control how you react.  Adding humor to your life by watching funny films or TV shows.  Making time for activities that help you relax and not feeling guilty about spending your time this way.  Medicines Your health care provider may suggest certain medicines if he or she feels that they will help improve your condition. Avoid using alcohol and other substances that may prevent your medicines from working properly (may interact). It is also important to:  Talk with your pharmacist or health care provider about all the medicines that you take, their possible side effects, and what medicines are safe to take together.  Make it your goal to take part in all treatment decisions (shared decision-making). This includes giving input on   the side effects of medicines. It is best if shared decision-making with your health care provider is part of your total treatment plan. If your health care provider prescribes a medicine, you may not notice the full benefits of it for 4-8 weeks. Most people who are treated for depression need to be on medicine for at least 6-12 months after they feel better. If you are taking  medicines as part of your treatment, do not stop taking medicines without first talking to your health care provider. You may need to have the medicine slowly decreased (tapered) over time to decrease the risk of harmful side effects. Relationships Your health care provider may suggest family therapy along with individual therapy and drug therapy. While there may not be family problems that are causing you to feel depressed, it is still important to make sure your family learns as much as they can about your mental health. Having your family's support can help make your treatment successful. How to recognize changes in your condition Everyone has a different response to treatment for depression. Recovery from major depression happens when you have not had signs of major depression for two months. This may mean that you will start to:  Have more interest in doing activities.  Feel less hopeless than you did 2 months ago.  Have more energy.  Overeat less often, or have better or improving appetite.  Have better concentration. Your health care provider will work with you to decide the next steps in your recovery. It is also important to recognize when your condition is getting worse. Watch for these signs:  Having fatigue or low energy.  Eating too much or too little.  Sleeping too much or too little.  Feeling restless, agitated, or hopeless.  Having trouble concentrating or making decisions.  Having unexplained physical complaints.  Feeling irritable, angry, or aggressive. Get help as soon as you or your family members notice these symptoms coming back. How to get support and help from others How to talk with friends and family members about your condition  Talking to friends and family members about your condition can provide you with one way to get support and guidance. Reach out to trusted friends or family members, explain your symptoms to them, and let them know that you are  working with a health care provider to treat your depression. Financial resources Not all insurance plans cover mental health care, so it is important to check with your insurance carrier. If paying for co-pays or counseling services is a problem, search for a local or county mental health care center. They may be able to offer public mental health care services at low or no cost when you are not able to see a private health care provider. If you are taking medicine for depression, you may be able to get the generic form, which may be less expensive. Some makers of prescription medicines also offer help to patients who cannot afford the medicines they need. Follow these instructions at home:   Get the right amount and quality of sleep.  Cut down on using caffeine, tobacco, alcohol, and other potentially harmful substances.  Try to exercise, such as walking or lifting small weights.  Take over-the-counter and prescription medicines only as told by your health care provider.  Eat a healthy diet that includes plenty of vegetables, fruits, whole grains, low-fat dairy products, and lean protein. Do not eat a lot of foods that are high in solid fats, added sugars, or salt.    Keep all follow-up visits as told by your health care provider. This is important. Contact a health care provider if:  You stop taking your antidepressant medicines, and you have any of these symptoms: ? Nausea. ? Headache. ? Feeling lightheaded. ? Chills and body aches. ? Not being able to sleep (insomnia).  You or your friends and family think your depression is getting worse. Get help right away if:  You have thoughts of hurting yourself or others. If you ever feel like you may hurt yourself or others, or have thoughts about taking your own life, get help right away. You can go to your nearest emergency department or call:  Your local emergency services (911 in the [place]  A suicide crisis helpline, such as the  National Suicide Prevention Lifeline at 1-800-273-8255. This is open 24-hours a day. Summary  If you are living with depression, there are ways to help you recover from it and also ways to prevent it from coming back.  Work with your health care team to create a management plan that includes counseling, stress management techniques, and healthy lifestyle habits. This information is not intended to replace advice given to you by your health care provider. Make sure you discuss any questions you have with your health care provider. Document Released: 06/06/2016 Document Revised: 10/26/2018 Document Reviewed: 06/06/2016 Elsevier Patient Education  2020 [employer]  

## 2019-04-18 LAB — CBC WITH DIFFERENTIAL/PLATELET
Basophils Absolute: 0.1 10*3/uL (ref 0.0–0.2)
Basos: 1 %
EOS (ABSOLUTE): 0.1 10*3/uL (ref 0.0–0.4)
Eos: 1 %
Hematocrit: 37 % (ref 34.0–46.6)
Hemoglobin: 11.3 g/dL (ref 11.1–15.9)
Immature Grans (Abs): 0 10*3/uL (ref 0.0–0.1)
Immature Granulocytes: 0 %
Lymphocytes Absolute: 2.4 10*3/uL (ref 0.7–3.1)
Lymphs: 35 %
MCH: 22.6 pg — ABNORMAL LOW (ref 26.6–33.0)
MCHC: 30.5 g/dL — ABNORMAL LOW (ref 31.5–35.7)
MCV: 74 fL — ABNORMAL LOW (ref 79–97)
Monocytes Absolute: 0.5 10*3/uL (ref 0.1–0.9)
Monocytes: 7 %
Neutrophils Absolute: 3.8 10*3/uL (ref 1.4–7.0)
Neutrophils: 56 %
Platelets: 336 10*3/uL (ref 150–450)
RBC: 4.99 x10E6/uL (ref 3.77–5.28)
RDW: 17 % — ABNORMAL HIGH (ref 11.7–15.4)
WBC: 6.8 10*3/uL (ref 3.4–10.8)

## 2019-04-18 LAB — CMP14+EGFR
ALT: 17 IU/L (ref 0–32)
AST: 17 IU/L (ref 0–40)
Albumin/Globulin Ratio: 2 (ref 1.2–2.2)
Albumin: 4.5 g/dL (ref 3.9–5.0)
Alkaline Phosphatase: 140 IU/L — ABNORMAL HIGH (ref 39–117)
BUN/Creatinine Ratio: 7 — ABNORMAL LOW (ref 9–23)
BUN: 7 mg/dL (ref 6–20)
Bilirubin Total: 0.2 mg/dL (ref 0.0–1.2)
CO2: 21 mmol/L (ref 20–29)
Calcium: 9.5 mg/dL (ref 8.7–10.2)
Chloride: 108 mmol/L — ABNORMAL HIGH (ref 96–106)
Creatinine, Ser: 1 mg/dL (ref 0.57–1.00)
GFR calc Af Amer: 89 mL/min/{1.73_m2} (ref >59)
GFR calc non Af Amer: 77 mL/min/{1.73_m2} (ref >59)
Globulin, Total: 2.2 g/dL (ref 1.5–4.5)
Glucose: 99 mg/dL (ref 65–99)
Potassium: 4.5 mmol/L (ref 3.5–5.2)
Sodium: 142 mmol/L (ref 134–144)
Total Protein: 6.7 g/dL (ref 6.0–8.5)

## 2019-04-18 LAB — THYROID PANEL WITH TSH
Free Thyroxine Index: 1.9 (ref 1.2–4.9)
T3 Uptake Ratio: 24 % (ref 24–39)
T4, Total: 7.9 ug/dL (ref 4.5–12.0)
TSH: 2.52 u[IU]/mL (ref 0.450–4.500)

## 2019-04-18 LAB — H. PYLORI BREATH TEST: H pylori Breath Test: NEGATIVE

## 2019-04-18 LAB — H. PYLORI BREATH COLLECTION

## 2019-04-19 ENCOUNTER — Other Ambulatory Visit: Payer: Self-pay | Admitting: Adult Health Nurse Practitioner

## 2019-04-19 DIAGNOSIS — F331 Major depressive disorder, recurrent, moderate: Secondary | ICD-10-CM

## 2019-04-19 DIAGNOSIS — R112 Nausea with vomiting, unspecified: Secondary | ICD-10-CM

## 2019-05-08 ENCOUNTER — Ambulatory Visit: Payer: BC Managed Care – PPO | Admitting: Adult Health Nurse Practitioner

## 2019-05-08 ENCOUNTER — Telehealth: Payer: Self-pay | Admitting: Adult Health Nurse Practitioner

## 2019-05-08 NOTE — Telephone Encounter (Signed)
There was a CRM in stating that pt needed to r/s appt. Called her and no answer and no VM set up.

## 2019-05-13 ENCOUNTER — Encounter: Payer: Self-pay | Admitting: Gastroenterology

## 2019-05-14 ENCOUNTER — Other Ambulatory Visit: Payer: Self-pay

## 2019-05-14 ENCOUNTER — Encounter: Payer: Self-pay | Admitting: Adult Health Nurse Practitioner

## 2019-05-14 ENCOUNTER — Ambulatory Visit (INDEPENDENT_AMBULATORY_CARE_PROVIDER_SITE_OTHER): Payer: BC Managed Care – PPO | Admitting: Adult Health Nurse Practitioner

## 2019-05-14 VITALS — BP 106/68 | HR 79 | Temp 98.6°F | Resp 18 | Ht 64.17 in | Wt 224.0 lb

## 2019-05-14 DIAGNOSIS — F331 Major depressive disorder, recurrent, moderate: Secondary | ICD-10-CM | POA: Diagnosis not present

## 2019-05-14 DIAGNOSIS — Z23 Encounter for immunization: Secondary | ICD-10-CM | POA: Diagnosis not present

## 2019-05-14 DIAGNOSIS — D509 Iron deficiency anemia, unspecified: Secondary | ICD-10-CM | POA: Diagnosis not present

## 2019-05-15 ENCOUNTER — Encounter: Payer: Self-pay | Admitting: Adult Health Nurse Practitioner

## 2019-05-15 DIAGNOSIS — D509 Iron deficiency anemia, unspecified: Secondary | ICD-10-CM | POA: Insufficient documentation

## 2019-05-15 HISTORY — DX: Iron deficiency anemia, unspecified: D50.9

## 2019-05-15 MED ORDER — FERROUS SULFATE 324 (65 FE) MG PO TBEC: DELAYED_RELEASE_TABLET | ORAL | 3 refills | Status: AC

## 2019-05-15 NOTE — Progress Notes (Signed)
  Chief Complaint  Patient presents with  . Depression    f/u since starting Zoloft.  Feels that it is helping.     HPI   Patient is following up for her depression.  Feels that the Zoloft has helped her mood.  Admits to fatigue and sleeping a lot.  She has had a discussion with her partner about his role in childcare and things have improved there somewhat.  She feels less depressed.  Denies anxiety.  Still somewhat flat.    Reviewed her blood work with her today which was significant for Anemia and Vitamin D deficiency.  Fatigue could be multifacted due in part to the anemia and the depression.  I have advised Iron supplementation.    We discussed adding a SNRI like Wellbutrin for the fatigue.  She declined at this time.  Has not seen a counselor yet.  I encouraged her to make an appointment as it is easier with telehealth.   Review of Systems  Constitutional: Negative for activity change, appetite change, chills and fever.  HENT: Negative for congestion, nosebleeds, trouble swallowing and voice change.   Respiratory: Negative for cough, shortness of breath and wheezing.   Gastrointestinal: Negative for diarrhea, nausea and vomiting.  Genitourinary: Negative for difficulty urinating, dysuria, flank pain and hematuria.  Musculoskeletal: Negative for back pain, joint swelling and neck pain.  Neurological: Negative for dizziness, speech difficulty, light-headedness and numbness.  See HPI. All other review of systems negative  is allergic to kiwi extract.  Psychiatric Specialty Exam: Physical Exam  ROS  Blood pressure 106/68, pulse 79, temperature 98.6 F (37 C), temperature source Oral, resp. rate 18, height 5' 4.17" (1.63 m), weight 224 lb (101.6 kg), SpO2 99 %, not currently breastfeeding.Body mass index is 38.24 kg/m.  General Appearance: Casual  Eye Contact:  Good  Speech:  Normal Rate  Volume:  Normal  Mood:  Dysphoric  Affect:  Flat  Thought Process:  Coherent   Orientation:  Full (Time, Place, and Person)  Thought Content:  Logical   Medications     Current Outpatient Medications:  .  sertraline (ZOLOFT) 50 MG tablet, Take 1 tablet (50 mg total) by mouth daily., Disp: 30 tablet, Rfl: 3  Problem List   1.  Depression 2.  IDA   Assessment & Plan:  KHORI UNDERBERG is a 28 y.o. female . Need for prophylactic vaccination and inoculation against influenza - Plan: Flu Vaccine QUAD 6+ mos PF IM (Fluarix Quad PF) No orders of the defined types were placed in this encounter.   1. Need for prophylactic vaccination and inoculation against influenza   2. Moderate episode of recurrent major depressive disorder ([place])   3. Iron deficiency anemia, unspecified iron deficiency anemia type      Glyn Ade, NP

## 2019-07-16 ENCOUNTER — Other Ambulatory Visit: Payer: Self-pay

## 2019-07-16 ENCOUNTER — Ambulatory Visit (INDEPENDENT_AMBULATORY_CARE_PROVIDER_SITE_OTHER): Payer: BC Managed Care – PPO | Admitting: Adult Health Nurse Practitioner

## 2019-07-16 VITALS — BP 108/78 | HR 75 | Temp 98.1°F | Ht 64.0 in | Wt 229.6 lb

## 2019-07-16 DIAGNOSIS — F331 Major depressive disorder, recurrent, moderate: Secondary | ICD-10-CM | POA: Diagnosis not present

## 2019-07-16 NOTE — Patient Instructions (Signed)
° ° ° °  If you have lab work done today you will be contacted with your lab results within the next 2 weeks.  If you have not heard from us then please contact us. The fastest way to get your results is to register for My Chart. ° ° °IF you received an x-ray today, you will receive an invoice from [facility]. Please contact  Radiology at [phone] with questions or concerns regarding your invoice.  ° °IF you received labwork today, you will receive an invoice from [facility]. Please contact [facility] at [phone] with questions or concerns regarding your invoice.  ° °Our billing staff will not be able to assist you with questions regarding bills from these companies. ° °You will be contacted with the lab results as soon as they are available. The fastest way to get your results is to activate your My Chart account. Instructions are located on the last page of this paperwork. If you have not heard from us regarding the results in 2 weeks, please contact this office. °  ° ° ° °

## 2019-07-16 NOTE — Progress Notes (Signed)
  Chief Complaint  Patient presents with  . Follow-up    x2 mos chronic medical conditions  . Score 23    HPI  Amanda Campos presents for f/u of her depression.  She continues to feel that she is better than before.  She is still experiencing financial stressors with her boyfriend quitting his job and getting one that pays much less.  She is still tired.  She stopped taking the iron as the combination with Zoloft made her very constipated.  She is trying to include more foods with adequate iron intake in her system.  She is eating more spinach.     Problem List    Problem List: 2020-10: Iron deficiency anemia 2020-09: Depression 2020-09: Nausea with vomiting 2020-05: Cesarean delivery delivered 2020-05: Encounter for maternal care for low transverse scar from  repeat cesarean delivery 2013-12: Late prenatal care 2013-12: Hypertension complicating pregnancy   Allergies   is allergic to kiwi extract.  Medications    Current Outpatient Medications:  .  sertraline (ZOLOFT) 50 MG tablet, Take 1 tablet (50 mg total) by mouth daily., Disp: 30 tablet, Rfl: 3 .  ferrous sulfate 324 (65 Fe) MG TBEC, 3x a week bid with acidic beverage (Patient not taking: Reported on 07/16/2019), Disp: 30 tablet, Rfl: 3   Review of Systems    Constitutional: Negative for activity change, appetite change, chills and fever.  HENT: Negative for congestion, nosebleeds, trouble swallowing and voice change.   Respiratory: Negative for cough, shortness of breath and wheezing.   Gastrointestinal: Negative for diarrhea, nausea and vomiting.  Genitourinary: Negative for difficulty urinating, dysuria, flank pain and hematuria.  Musculoskeletal: Negative for back pain, joint swelling and neck pain.  Neurological: Negative for dizziness, speech difficulty, light-headedness and numbness.  See HPI. All other review of systems negative.     Physical Exam:   Physical Examination: General appearance - alert, well  appearing, and in no distress and oriented to person, place, and time Mental status - normal mood, behavior, speech, dress, motor activity, and thought processes Mental Status: alert, oriented to person, place, and time, normal mood, behavior, speech, dress, motor activity, and thought processes. Extremities - dependent LE edema without clubbing or cyanosis Skin - normal coloration and turgor, no rashes, no suspicious skin lesions noted  No hyperpigmentation of skin.  No current hematomas noted   Lab Review   labs are reviewed, up to date and normal.   Assessment & Plan:  Amanda Campos is a 28 y.o. female . 1. Moderate episode of recurrent major depressive disorder ([place])    Continue Zoloft at current dose.  Declined Wellbutrin at this time.   Glyn Ade, NP

## 2019-10-15 ENCOUNTER — Ambulatory Visit: Payer: BC Managed Care – PPO | Admitting: Adult Health Nurse Practitioner

## 2019-10-16 ENCOUNTER — Encounter: Payer: Self-pay | Admitting: Adult Health Nurse Practitioner

## 2019-11-30 DIAGNOSIS — Z1152 Encounter for screening for COVID-19: Secondary | ICD-10-CM | POA: Diagnosis not present

## 2019-11-30 DIAGNOSIS — J00 Acute nasopharyngitis [common cold]: Secondary | ICD-10-CM | POA: Diagnosis not present

## 2020-01-24 ENCOUNTER — Encounter: Payer: Self-pay | Admitting: Emergency Medicine

## 2020-01-24 ENCOUNTER — Emergency Department
Admission: EM | Admit: 2020-01-24 | Discharge: 2020-01-24 | Disposition: A | Discharge status: Home / Self Care | Payer: BC Managed Care – PPO | Attending: Student in an Organized Health Care Education/Training Program | Admitting: Student in an Organized Health Care Education/Training Program

## 2020-01-24 ENCOUNTER — Other Ambulatory Visit: Payer: Self-pay

## 2020-01-24 ENCOUNTER — Emergency Department: Payer: BC Managed Care – PPO

## 2020-01-24 DIAGNOSIS — I10 Essential (primary) hypertension: Secondary | ICD-10-CM | POA: Insufficient documentation

## 2020-01-24 DIAGNOSIS — Y9389 Activity, other specified: Secondary | ICD-10-CM | POA: Diagnosis not present

## 2020-01-24 DIAGNOSIS — M791 Myalgia, unspecified site: Secondary | ICD-10-CM | POA: Diagnosis not present

## 2020-01-24 DIAGNOSIS — Z041 Encounter for examination and observation following transport accident: Secondary | ICD-10-CM | POA: Diagnosis not present

## 2020-01-24 DIAGNOSIS — Y92410 Unspecified street and highway as the place of occurrence of the external cause: Secondary | ICD-10-CM | POA: Diagnosis not present

## 2020-01-24 DIAGNOSIS — Y999 Unspecified external cause status: Secondary | ICD-10-CM | POA: Diagnosis not present

## 2020-01-24 DIAGNOSIS — M542 Cervicalgia: Secondary | ICD-10-CM | POA: Diagnosis not present

## 2020-01-24 DIAGNOSIS — M7918 Myalgia, other site: Secondary | ICD-10-CM

## 2020-01-24 LAB — URINALYSIS, COMPLETE (UACMP) WITH MICROSCOPIC
Bilirubin Urine: NEGATIVE
Glucose, UA: NEGATIVE mg/dL
Hgb urine dipstick: NEGATIVE
Ketones, ur: NEGATIVE mg/dL
Leukocytes,Ua: NEGATIVE
Nitrite: NEGATIVE
Protein, ur: NEGATIVE mg/dL
Specific Gravity, Urine: 1.027 (ref 1.005–1.030)
pH: 5 (ref 5.0–8.0)

## 2020-01-24 LAB — POCT PREGNANCY, URINE: Preg Test, Ur: NEGATIVE

## 2020-01-24 MED ORDER — ONDANSETRON 4 MG PO TBDP
4.0000 mg | ORAL_TABLET | Freq: Once | ORAL | Status: AC
  Administered 2020-01-24: 4 mg via ORAL
  Filled 2020-01-24: qty 1

## 2020-01-24 MED ORDER — KETOROLAC TROMETHAMINE 30 MG/ML IJ SOLN
30.0000 mg | Freq: Once | INTRAMUSCULAR | Status: AC
  Administered 2020-01-24: 30 mg via INTRAMUSCULAR
  Filled 2020-01-24: qty 1

## 2020-01-24 MED ORDER — IBUPROFEN 800 MG PO TABS: 800.0000 mg | ORAL_TABLET | Freq: Three times a day (TID) | ORAL | 0 refills | Status: AC | PRN

## 2020-01-24 MED ORDER — ONDANSETRON 4 MG PO TBDP: 4.0000 mg | ORAL_TABLET | Freq: Three times a day (TID) | ORAL | 0 refills | Status: AC | PRN

## 2020-01-24 MED ORDER — CYCLOBENZAPRINE HCL 5 MG PO TABS: 5.0000 mg | ORAL_TABLET | Freq: Three times a day (TID) | ORAL | 0 refills | Status: DC | PRN

## 2020-01-24 MED ORDER — CYCLOBENZAPRINE HCL 10 MG PO TABS
10.0000 mg | ORAL_TABLET | Freq: Once | ORAL | Status: AC
  Administered 2020-01-24: 10 mg via ORAL
  Filled 2020-01-24: qty 1

## 2020-01-24 MED ORDER — ACETAMINOPHEN 325 MG PO TABS
650.0000 mg | ORAL_TABLET | Freq: Once | ORAL | Status: AC
  Administered 2020-01-24: 650 mg via ORAL
  Filled 2020-01-24: qty 2
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
## 2020-01-24 NOTE — ED Notes (Signed)
Pt provided cup to provide urine sample
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
## 2020-05-31 IMAGING — US US OB TRANSVAGINAL
1 series · 15 of 28 positions shown · non-contrast
Comparison: None.

CLINICAL DATA: Pelvic pain.  Positive pregnancy test.

EXAM:
OBSTETRIC <14 WK US AND TRANSVAGINAL OB US
TECHNIQUE: Both transabdominal and transvaginal ultrasound examinations were
performed for complete evaluation of the gestation as well as the
maternal uterus, adnexal regions, and pelvic cul-de-sac.
Transvaginal technique was performed to assess early pregnancy.

[Series 1: us ob transvaginal · 15 of 109 slices shown]
[im 1/109]
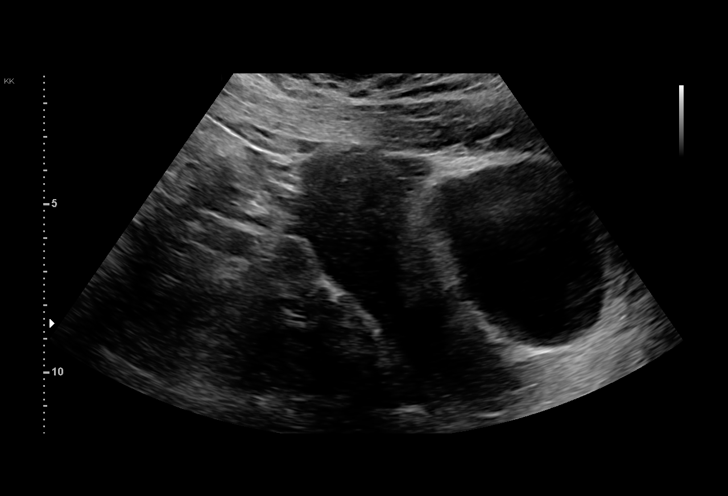
[im 9/109]
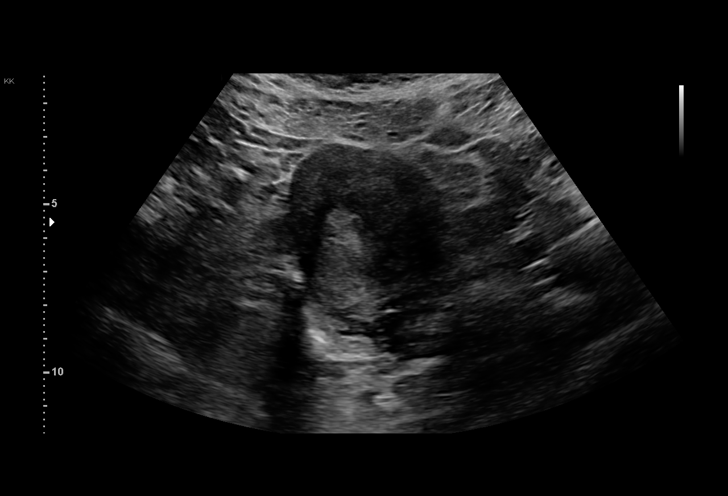
[im 17/109]
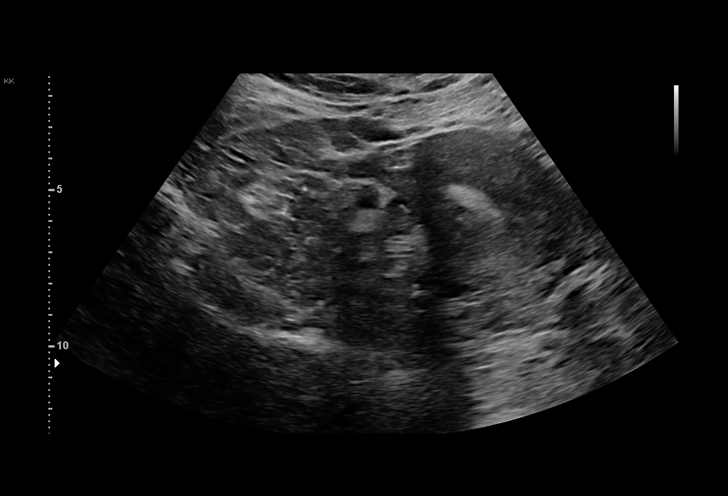
[im 25/109]
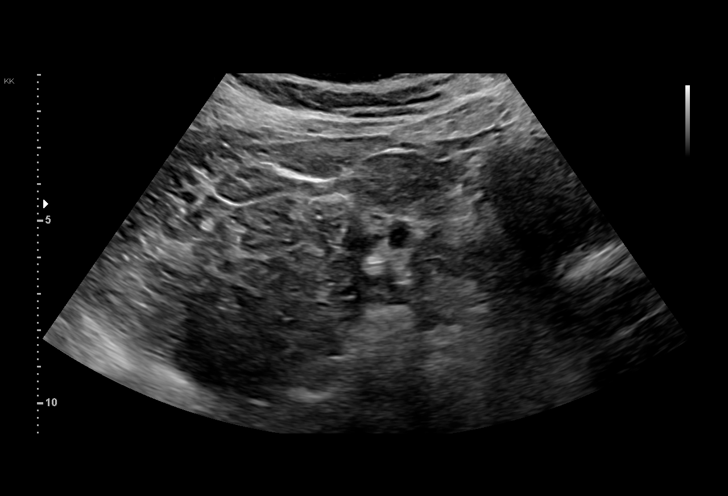
[im 33/109]
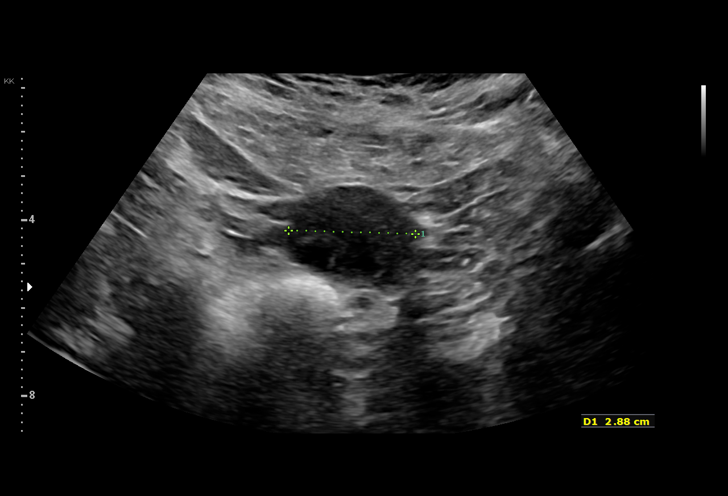
[im 41/109]
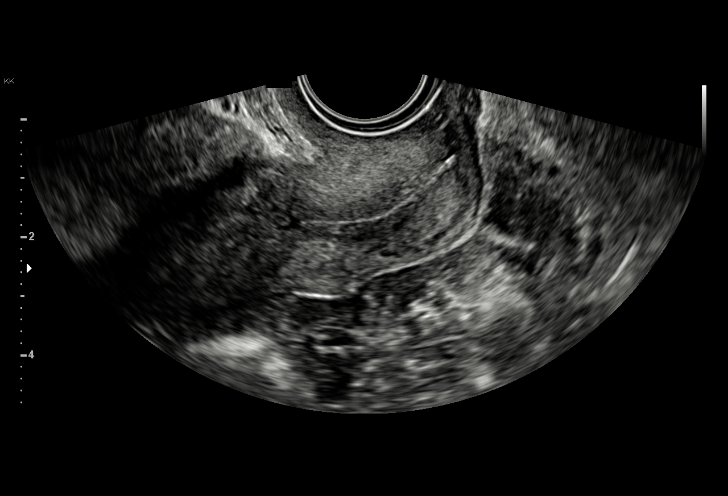
[im 49/109]
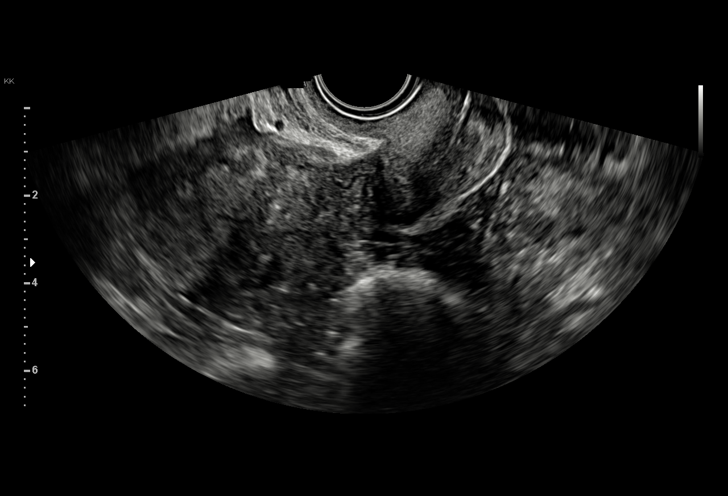
[im 57/109]
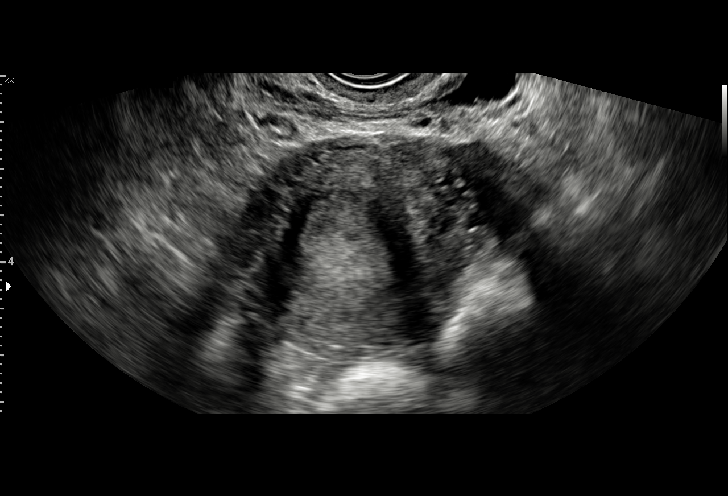
[im 61/109]
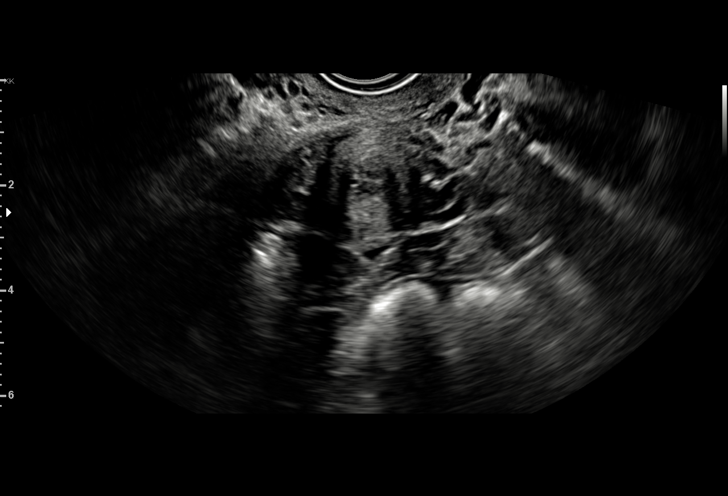
[im 69/109]
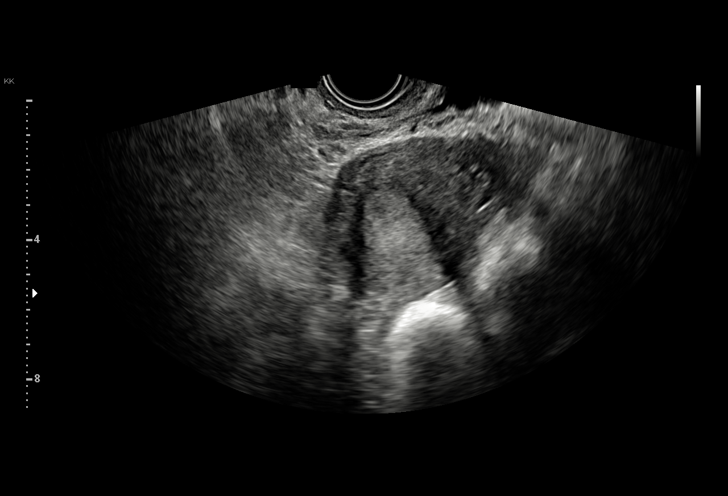
[im 77/109]
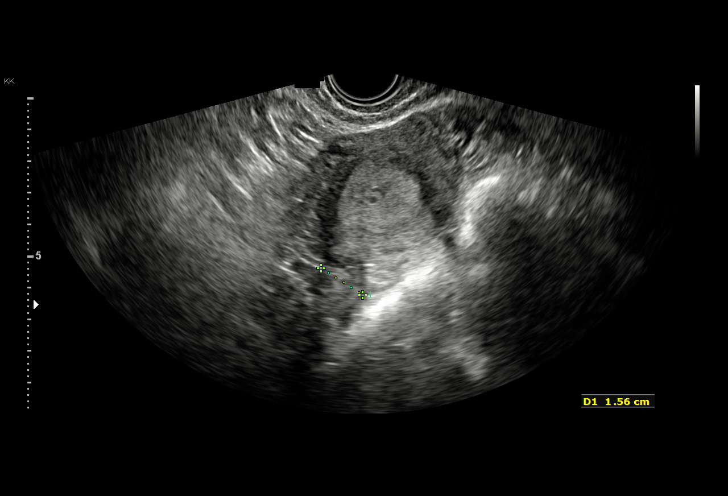
[im 85/109]
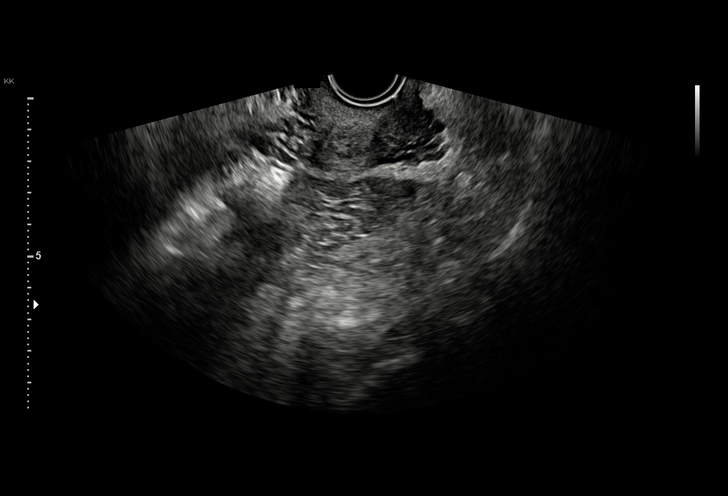
[im 93/109]
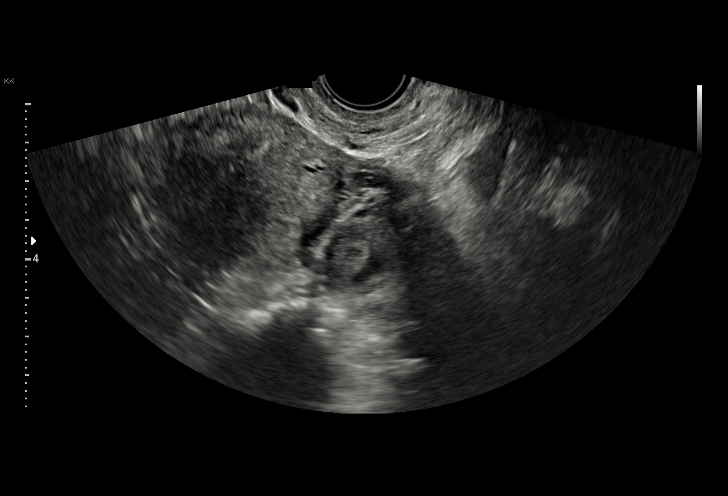
[im 101/109]
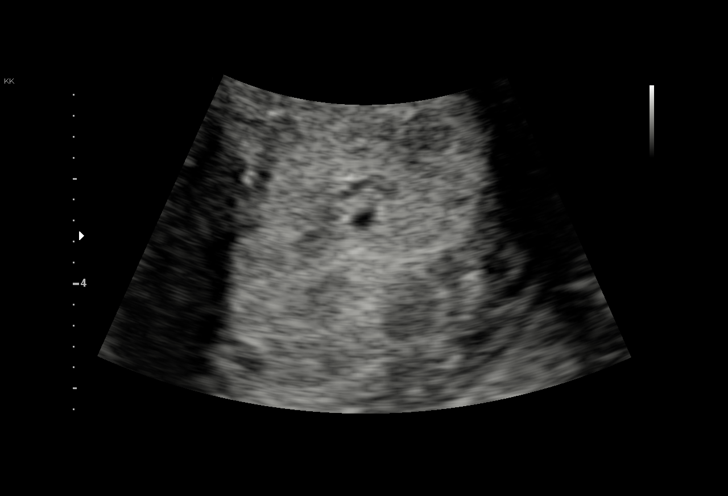
[im 109/109]
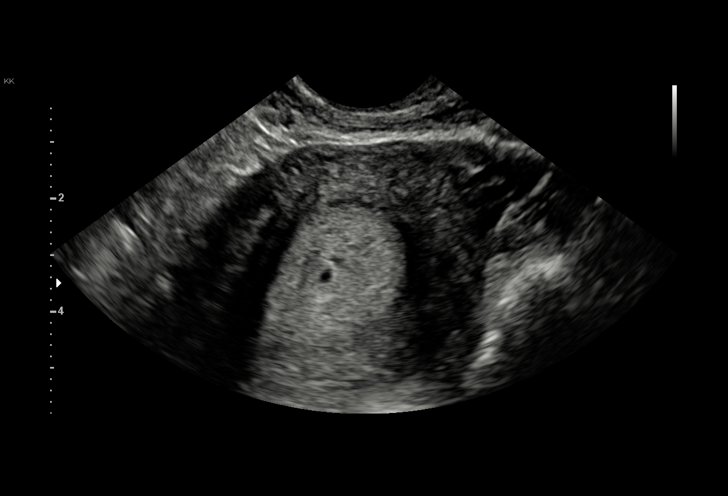

[15 of 28 positions shown; findings below may reference images not displayed]

FINDINGS: Intrauterine gestational sac: Present

Yolk sac:  None

Embryo:  None

Cardiac Activity: N/A

Heart Rate: N/A bpm

MSD: 2.39 mm   4 w   6 d

Subchorionic hemorrhage:  None visualized.

Maternal uterus/adnexae: Both ovaries are normal. Corpus luteum cyst
noted on the left.

Trace free pelvic fluid.
IMPRESSION: Probable early intrauterine gestational sac, but no yolk sac, fetal
pole, or cardiac activity yet visualized. Recommend follow-up
quantitative B-HCG levels and follow-up US in 14 days to assess
viability. This recommendation follows SRU consensus guidelines:
Diagnostic Criteria for Nonviable Pregnancy Early in the First
Trimester. N Engl J Med 9087; [DATE].

Normal ovaries.  Corpus luteum cyst noted on the left.

## 2020-10-17 IMAGING — US US MFM OB LIMITED
1 series · 15 of 28 positions shown · non-contrast
Comparison: none

[Series 1: us mfm ob limited · 30 acquisitions, 15 frames shown]
[im 1/30]
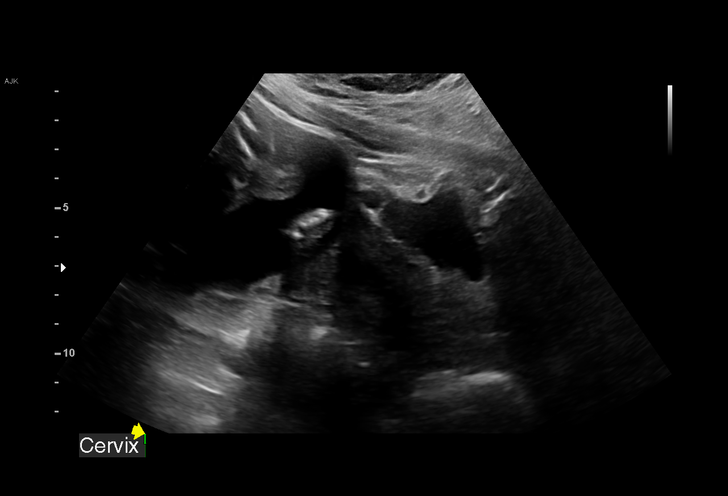
[im 3/30]
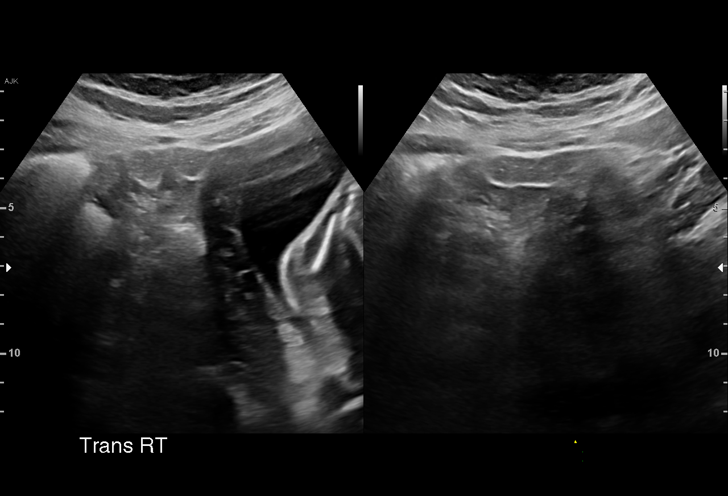
[im 5/30]
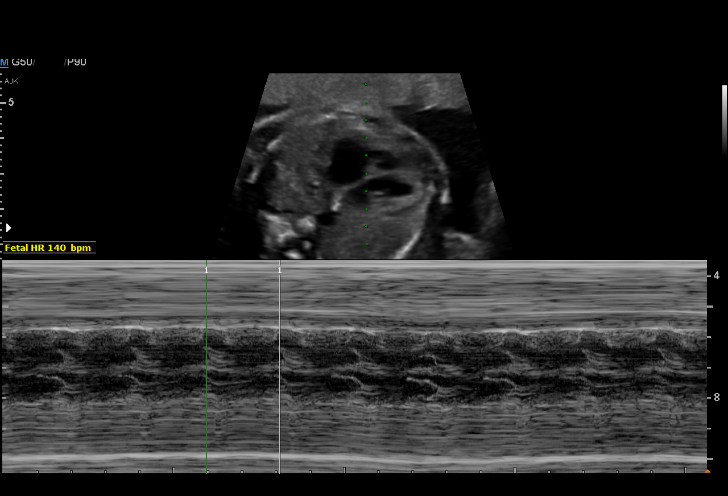
[im 7/30]
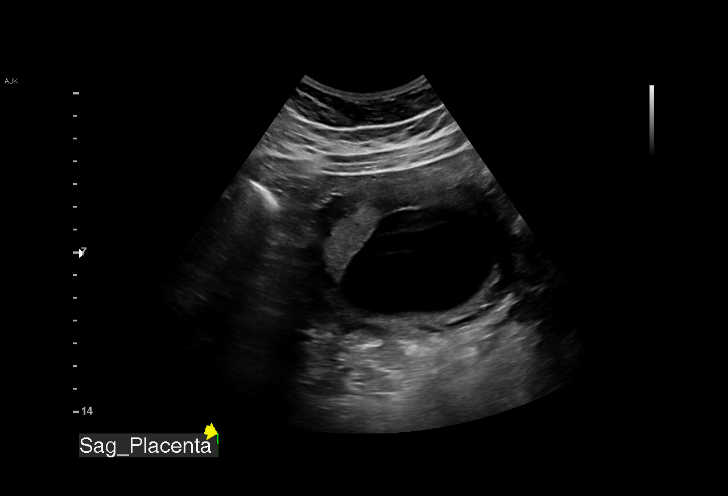
[im 9/30]
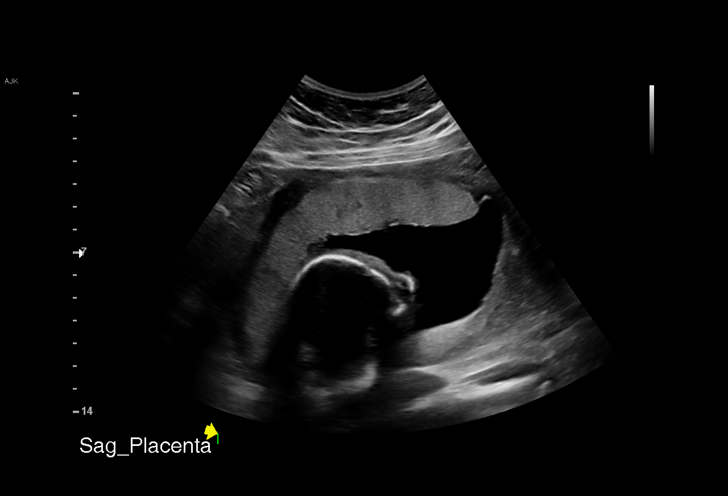
[im 11/30]
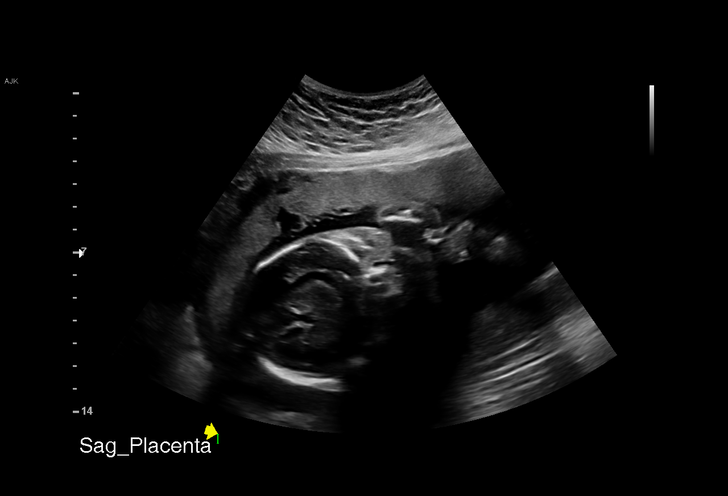
[im 13/30]
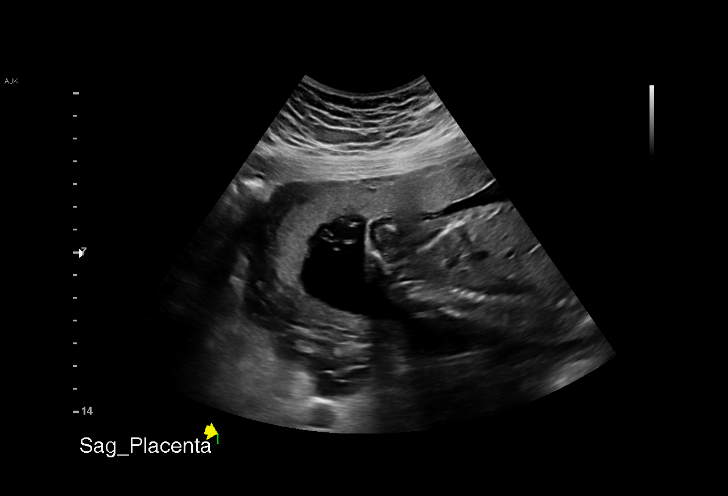
[im 16/30]
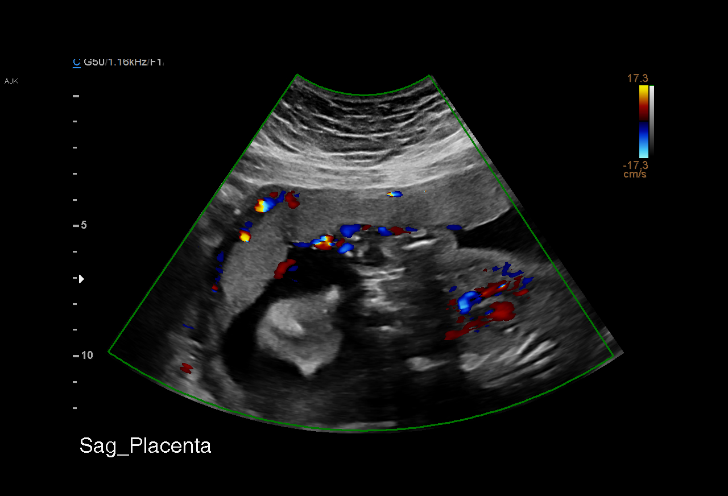
[im 17/30]
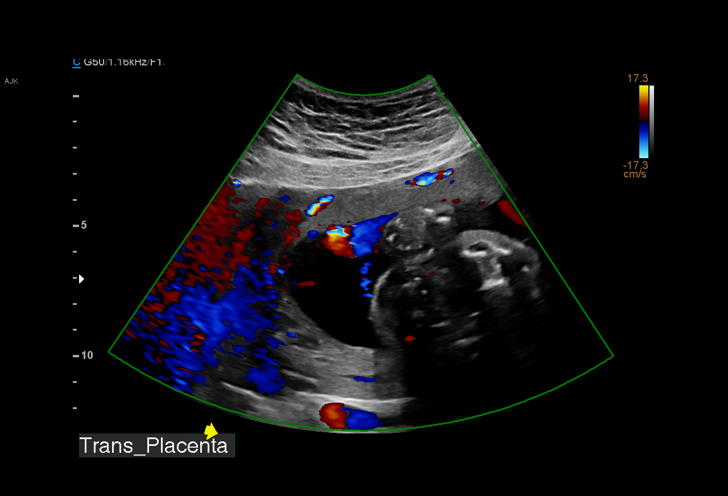
[im 19/30]
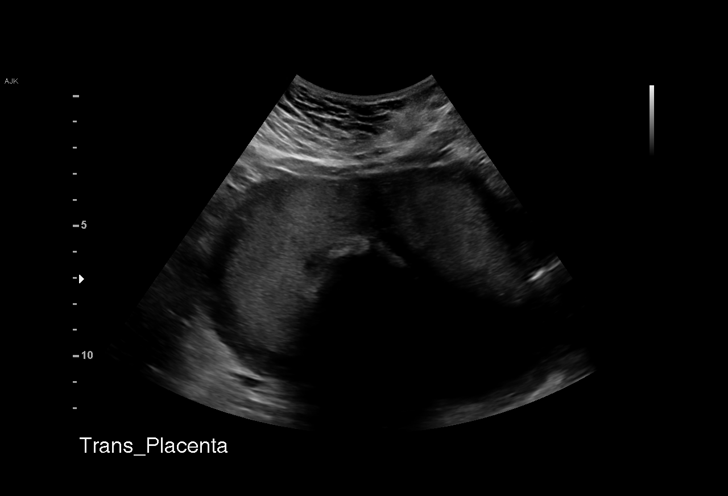
[im 21/30]
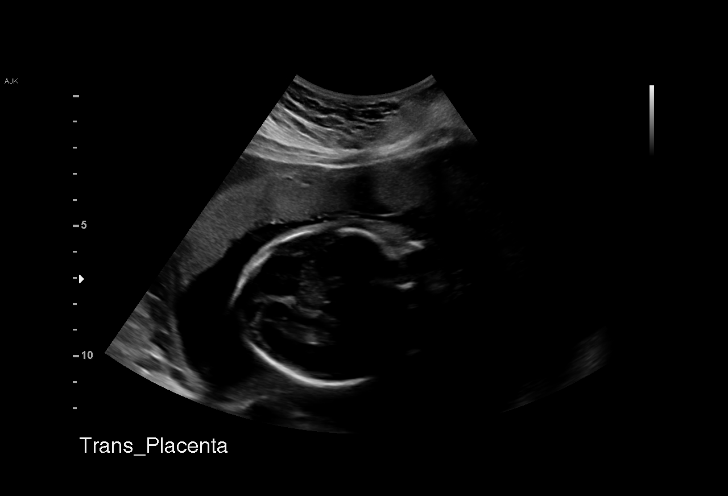
[im 23/30]
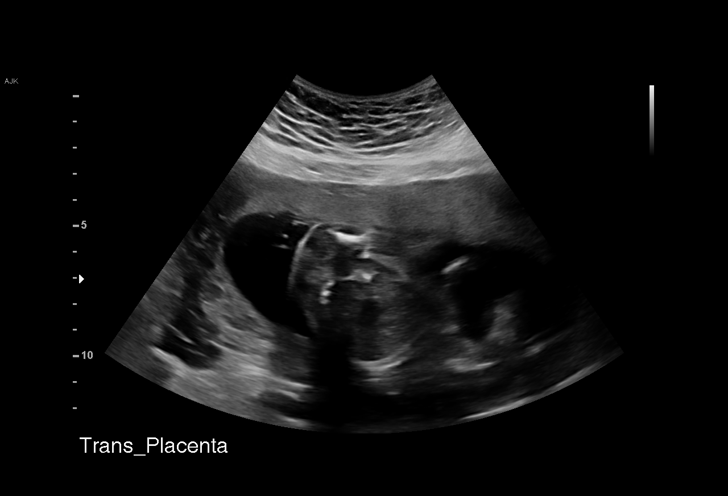
[im 25/30]
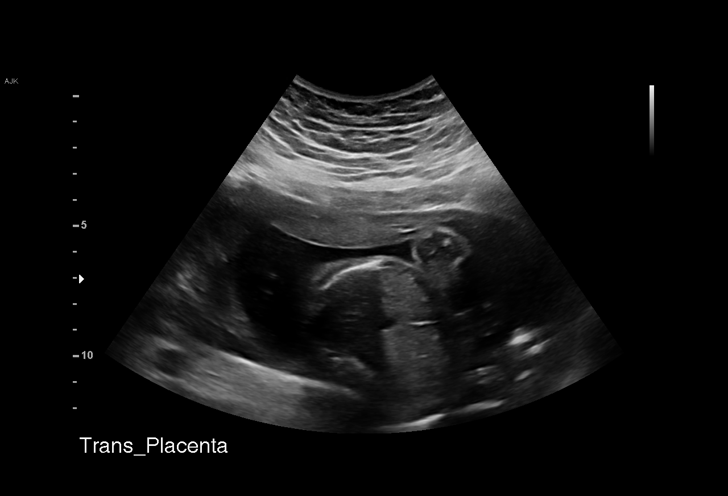
[im 27/30]
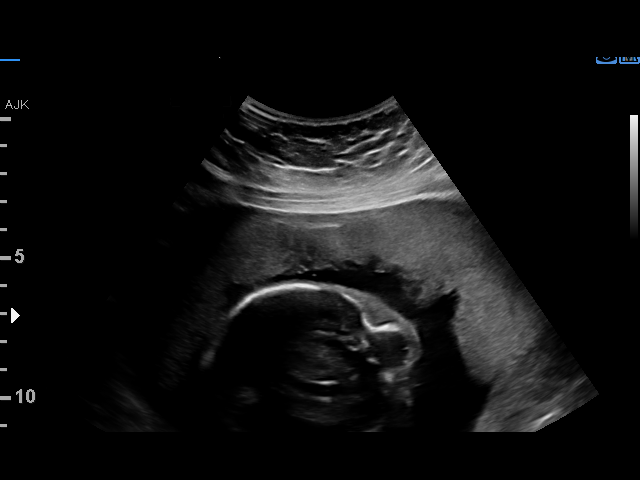
[im 30/30]
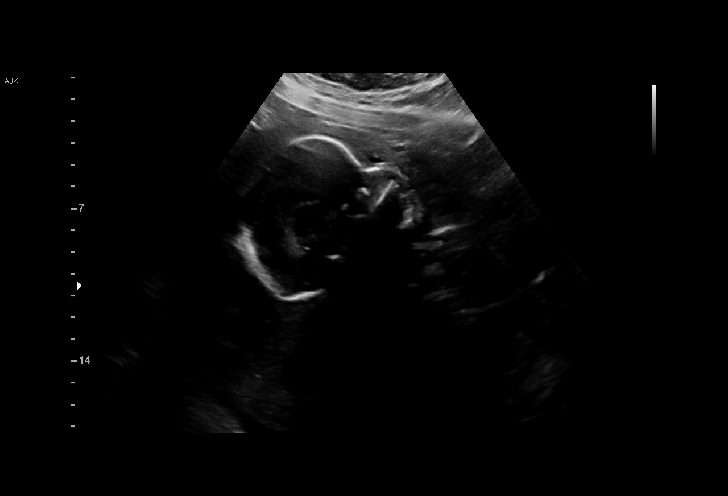

[15 of 28 positions shown; findings below may reference images not displayed]

1  US MFM OB LIMITED                    76815.01     RALF PETER SASSEN
 ----------------------------------------------------------------------

 ----------------------------------------------------------------------
Indications

  24 weeks gestation of pregnancy
  Maternal morbid obesity
  Traumatic injury during pregnancy (fell down
  stairs)
  Previous cesarean delivery, antepartum
  Poor obstetric history: Previous preterm
  delivery, antepartum (03w4d)
 ----------------------------------------------------------------------
Vital Signs

 BMI:
Fetal Evaluation

 Num Of Fetuses:          1
 Fetal Heart Rate(bpm):   140
 Cardiac Activity:        Observed
 Presentation:            Breech
 Placenta:                Anterior
 P. Cord Insertion:       Visualized, central

 Amniotic Fluid
 AFI FV:      Within normal limits

                             Largest Pocket(cm)


 Comment:    Bladder noted. No placental abruption or previa identified.
OB History

 Gravidity:    2         Prem:   1
Gestational Age

 LMP:           34w 1d        Date:  12/17/17                 EDD:   09/23/18
 Clinical EDD:  24w 3d                                        EDD:   11/30/18
 Best:          24w 3d     Det. By:  Clinical EDD             EDD:   11/30/18
Cervix Uterus Adnexa

 Cervix
 Length:            5.1  cm.
 Normal appearance by transabdominal scan.

 Uterus
 No abnormality visualized.

 Left Ovary
 Within normal limits.

 Right Ovary
 Not visualized.

 Adnexa
 No abnormality visualized.
Impression

 Normal amniotic fluid
 No evidence of placental abruption or previa
Recommendations

 Follow up as clinically indicated.

## 2020-11-22 ENCOUNTER — Ambulatory Visit (HOSPITAL_COMMUNITY)
Admission: EM | Admit: 2020-11-22 | Discharge: 2020-11-22 | Disposition: A | Discharge status: Home / Self Care | Payer: Managed Care, Other (non HMO) | Attending: Physician Assistant | Admitting: Physician Assistant

## 2020-11-22 ENCOUNTER — Other Ambulatory Visit: Payer: Self-pay

## 2020-11-22 ENCOUNTER — Encounter (HOSPITAL_COMMUNITY): Payer: Self-pay

## 2020-11-22 DIAGNOSIS — K644 Residual hemorrhoidal skin tags: Secondary | ICD-10-CM

## 2020-11-22 MED ORDER — HYDROCORTISONE (PERIANAL) 2.5 % EX CREA: 1.0000 "application " | TOPICAL_CREAM | Freq: Two times a day (BID) | CUTANEOUS | 0 refills | Status: DC | PRN

## 2020-11-22 NOTE — ED Provider Notes (Signed)
[facility]    CSN: [id number] Arrival date & time: 11/22/20  1106      History   Chief Complaint Chief Complaint  Patient presents with  . Hemorrhoids    HPI Amanda Campos is a 30 y.o. female.   Patient presents today with a 1 month history of rough and intermittently pruritic skin around anus.  She denies any swelling, pain, bleeding.  She is up-to-date on Gardasil vaccines and denies any history of anal sex.  She does report attempting anal sex 1 time which resulted in fissure and states this is the area where abnormality of skin has been noted.  She has not tried any over-the-counter medications for symptom management.  She is concerned because she has a family history of colon cancer in her uncle that began with similar symptoms.  She does not currently have a primary care provider but is open to establishing with someone.  She denies any changes in bowel habits, blood in stool, nausea, vomiting, unintentional weight loss.     Past Medical History:  Diagnosis Date  . Depression 04/17/2019  . Infection 2011   kidney infection, was treated  . Iron deficiency anemia 05/15/2019  . Nausea with vomiting 04/17/2019  . Pregnancy induced hypertension   . UTI (lower urinary tract infection)     Patient Active Problem List   Diagnosis Date Noted  . Iron deficiency anemia 05/15/2019  . Depression 04/17/2019  . Nausea with vomiting 04/17/2019  . Cesarean delivery delivered 11/23/2018  . Encounter for maternal care for low transverse scar from repeat cesarean delivery 11/22/2018  . Late prenatal care 07/03/2012  . Hypertension complicating pregnancy 07/03/2012    Past Surgical History:  Procedure Laterality Date  . CESAREAN SECTION  07/04/2012   Procedure: CESAREAN SECTION;  Surgeon: Kathreen Cosier, MD;  Location: [facility];  Service: Obstetrics;  Laterality: N/A;  . CESAREAN SECTION N/A 11/22/2018   Procedure: CESAREAN SECTION;  Surgeon: Waynard Reeds, MD;   Location: [facility];  Service: Obstetrics;  Laterality: N/A;  Tracey RNFA  . GANGLION CYST EXCISION    . TONSILLECTOMY      OB History    Gravida  2   Para  2   Term  1   Preterm  1   AB  0   Living  2     SAB  0   IAB  0   Ectopic  0   Multiple  0   Live Births  2            Home Medications    Prior to Admission medications   Medication Sig Start Date End Date Taking? Authorizing Provider  hydrocortisone (ANUSOL-HC) 2.5 % rectal cream Place 1 application rectally 2 (two) times daily as needed for hemorrhoids or anal itching. 11/22/20  Yes Keyra Virella K, PA-C  cyclobenzaprine (FLEXERIL) 5 MG tablet Take 1 tablet (5 mg total) by mouth 3 (three) times daily as needed. 01/24/20   Menshew, Charlesetta Ivory, PA-C  ferrous sulfate 324 (65 Fe) MG TBEC 3x a week bid with acidic beverage Patient not taking: Reported on 07/16/2019 05/15/19   Royal Hawthorn, NP  ibuprofen (ADVIL) 800 MG tablet Take 1 tablet (800 mg total) by mouth every 8 (eight) hours as needed. 01/24/20   Menshew, Charlesetta Ivory, PA-C  ondansetron (ZOFRAN ODT) 4 MG disintegrating tablet Take 1 tablet (4 mg total) by mouth every 8 (eight) hours as needed. 01/24/20  Menshew, Charlesetta Ivory, PA-C  sertraline (ZOLOFT) 50 MG tablet Take 1 tablet (50 mg total) by mouth daily. 04/17/19   Royal Hawthorn, NP    Family History Family History  Problem Relation Age of Onset  . Cancer Maternal Grandmother     Social History Social History   Tobacco Use  . Smoking status: Never Smoker  . Smokeless tobacco: Never Used  Vaping Use  . Vaping Use: Never used  Substance Use Topics  . Alcohol use: Not Currently    Comment: socially  . Drug use: No     Allergies   Kiwi extract   Review of Systems Review of Systems  Constitutional: Negative for activity change, appetite change, fatigue and fever.  Respiratory: Negative for cough and shortness of breath.   Cardiovascular: Negative for chest pain.   Gastrointestinal: Negative for abdominal pain, anal bleeding, blood in stool, diarrhea, nausea, rectal pain and vomiting.  Musculoskeletal: Negative for arthralgias and myalgias.  Neurological: Negative for dizziness, light-headedness and headaches.     Physical Exam Triage Vital Signs ED Triage Vitals  Enc Vitals Group     BP 11/22/20 1142 120/76     Pulse Rate 11/22/20 1142 81     Resp 11/22/20 1142 19     Temp 11/22/20 1142 98.6 F (37 C)     Temp Source 11/22/20 1142 Oral     SpO2 11/22/20 1142 100 %     Weight --      Height --      Head Circumference --      Peak Flow --      Pain Score 11/22/20 1141 0     Pain Loc --      Pain Edu? --      Excl. in GC? --    No data found.  Updated Vital Signs BP 120/76 (BP Location: Right Arm)   Pulse 81   Temp 98.6 F (37 C) (Oral)   Resp 19   LMP 11/20/2020 (Exact Date)   SpO2 100%   Visual Acuity Right Eye Distance:   Left Eye Distance:   Bilateral Distance:    Right Eye Near:   Left Eye Near:    Bilateral Near:     Physical Exam Vitals reviewed.  Constitutional:      General: She is awake. She is not in acute distress.    Appearance: Normal appearance. She is not ill-appearing.     Comments: Very pleasant female appears stated age in no acute distress  HENT:     Head: Normocephalic and atraumatic.  Cardiovascular:     Rate and Rhythm: Normal rate and regular rhythm.     Heart sounds: No murmur heard.   Pulmonary:     Effort: Pulmonary effort is normal.     Breath sounds: Normal breath sounds. No wheezing, rhonchi or rales.     Comments: Clear to auscultation bilaterally Abdominal:     General: Bowel sounds are normal.     Palpations: Abdomen is soft.     Tenderness: There is no abdominal tenderness. There is no right CVA tenderness, left CVA tenderness, guarding or rebound.     Comments: Benign abdominal exam  Genitourinary:    Rectum: No tenderness, external hemorrhoid or internal hemorrhoid.      Comments: Ricki Rodriguez present as chaperone during exam.  Multiple small skin tags noted at 6 o'clock position.  No hemorrhoids noted. Psychiatric:        Behavior: Behavior is cooperative.  UC Treatments / Results  Labs (all labs ordered are listed, but only abnormal results are displayed) Labs Reviewed - No data to display  EKG   Radiology No results found.  Procedures Procedures (including critical care time)  Medications Ordered in UC Medications - No data to display  Initial Impression / Assessment and Plan / UC Course  I have reviewed the triage vital signs and the nursing notes.  Pertinent labs & imaging results that were available during my care of the patient were reviewed by me and considered in my medical decision making (see chart for details).     Patient was prescribed Anusol to be used for occasional pruritus.  Encouraged her to avoid constipation as this can lead to hemorrhoids.  Discussed that this is not a sign of cancer but given her family history she should establish with a PCP and undergo routine cancer screenings.  We will try to find patient and PCP through PCP assistance.  Strict return precautions given to which patient expressed understanding.  Final Clinical Impressions(s) / UC Diagnoses   Final diagnoses:  Anal skin tag     Discharge Instructions     Make sure that you are avoiding constipation as this can lead to hemorrhoids and increase the number of skin tags.  The best way to do this is to eat plenty of fiber and drink plenty of fluid.  He can use over-the-counter hydrocortisone hemorrhoid cream if you have any itching or discomfort.  I do not see anything concerning for cancer but it is important that you follow-up with your PCP for all cancer screenings.  Placed a referral to find a PCP and someone to contact you to schedule an appointment.    ED Prescriptions    Medication Sig Dispense Auth. Provider   hydrocortisone (ANUSOL-HC) 2.5 %  rectal cream Place 1 application rectally 2 (two) times daily as needed for hemorrhoids or anal itching. 30 g Lucilla Petrenko, Noberto Retort, PA-C     PDMP not reviewed this encounter.   Jeani Hawking, PA-C 11/22/20 1201

## 2020-11-22 NOTE — Discharge Instructions (Signed)
Make sure that you are avoiding constipation as this can lead to hemorrhoids and increase the number of skin tags.  The best way to do this is to eat plenty of fiber and drink plenty of fluid.  He can use over-the-counter hydrocortisone hemorrhoid cream if you have any itching or discomfort.  I do not see anything concerning for cancer but it is important that you follow-up with your PCP for all cancer screenings.  Placed a referral to find a PCP and someone to contact you to schedule an appointment.

## 2020-11-22 NOTE — ED Triage Notes (Signed)
Pt c/o bumps on her buttocks x 1 month. She states the bumps are rough and hard.

## 2022-07-23 ENCOUNTER — Encounter (HOSPITAL_COMMUNITY): Payer: Self-pay

## 2022-07-23 ENCOUNTER — Ambulatory Visit (HOSPITAL_COMMUNITY)
Admission: EM | Admit: 2022-07-23 | Discharge: 2022-07-23 | Disposition: A | Discharge status: Home / Self Care | Payer: 59 | Attending: Emergency Medicine | Admitting: Emergency Medicine

## 2022-07-23 DIAGNOSIS — K529 Noninfective gastroenteritis and colitis, unspecified: Secondary | ICD-10-CM

## 2022-07-23 MED ORDER — ONDANSETRON HCL 4 MG PO TABS: 4.0000 mg | ORAL_TABLET | Freq: Three times a day (TID) | ORAL | 0 refills | Status: DC | PRN

## 2022-07-23 MED ORDER — LOPERAMIDE HCL 2 MG PO CAPS: 2.0000 mg | ORAL_CAPSULE | Freq: Four times a day (QID) | ORAL | 0 refills | Status: DC | PRN

## 2022-07-23 MED ORDER — AZITHROMYCIN 500 MG PO TABS: 500.0000 mg | ORAL_TABLET | Freq: Every day | ORAL | 0 refills | Status: AC

## 2022-07-23 NOTE — ED Provider Notes (Signed)
[facility]    CSN: [id number] Arrival date & time: 07/23/22  1712      History   Chief Complaint Chief Complaint  Patient presents with   Emesis   Diarrhea    HPI Amanda Campos is a 32 y.o. female.   Patient presents for evaluation of fever, abdominal pain, diarrhea and vomiting present for 21 days.  Symptoms started while working out at [employer], child had similar symptoms but has resolved.  Abdominal pain is centralized, intermittent and described as cramping.  Last occurrence of vomiting 1 day ago, emesis is described as bile.  Last occurrence of diarrhea this morning, stool described as watery decreased appetite eating fluids. endorses she is beginning to feel dizzy but denies lightheadedness or syncope.  No known sick contact prior.  Denies dietary changes or recent travel.    Past Medical History:  Diagnosis Date   Depression 04/17/2019   Infection 2011   kidney infection, was treated   Iron deficiency anemia 05/15/2019   Nausea with vomiting 04/17/2019   Pregnancy induced hypertension    UTI (lower urinary tract infection)     Patient Active Problem List   Diagnosis Date Noted   Iron deficiency anemia 05/15/2019   Depression 04/17/2019   Nausea with vomiting 04/17/2019   Cesarean delivery delivered 11/23/2018   Encounter for maternal care for low transverse scar from repeat cesarean delivery 11/22/2018   Late prenatal care 07/03/2012   Hypertension complicating pregnancy 15/17/6160    Past Surgical History:  Procedure Laterality Date   CESAREAN SECTION  07/04/2012   Procedure: CESAREAN SECTION;  Surgeon: Frederico Hamman, MD;  Location: [facility];  Service: Obstetrics;  Laterality: N/A;   CESAREAN SECTION N/A 11/22/2018   Procedure: CESAREAN SECTION;  Surgeon: Vanessa Kick, MD;  Location: [facility];  Service: Obstetrics;  Laterality: N/A;  Tracey RNFA   GANGLION CYST EXCISION     TONSILLECTOMY      OB History     Gravida  2   Para  2   Term   1   Preterm  1   AB  0   Living  2      SAB  0   IAB  0   Ectopic  0   Multiple  0   Live Births  2            Home Medications    Prior to Admission medications   Medication Sig Start Date End Date Taking? Authorizing Provider  cyclobenzaprine (FLEXERIL) 5 MG tablet Take 1 tablet (5 mg total) by mouth 3 (three) times daily as needed. 01/24/20   Menshew, Dannielle Karvonen, PA-C  ferrous sulfate 324 (65 Fe) MG TBEC 3x a week bid with acidic beverage Patient not taking: Reported on 07/16/2019 05/15/19   Wendall Mola, NP  hydrocortisone (ANUSOL-HC) 2.5 % rectal cream Place 1 application rectally 2 (two) times daily as needed for hemorrhoids or anal itching. 11/22/20   Raspet, Derry Skill, PA-C  ibuprofen (ADVIL) 800 MG tablet Take 1 tablet (800 mg total) by mouth every 8 (eight) hours as needed. 01/24/20   Menshew, Dannielle Karvonen, PA-C  ondansetron (ZOFRAN ODT) 4 MG disintegrating tablet Take 1 tablet (4 mg total) by mouth every 8 (eight) hours as needed. 01/24/20   Menshew, Dannielle Karvonen, PA-C  sertraline (ZOLOFT) 50 MG tablet Take 1 tablet (50 mg total) by mouth daily. 04/17/19   Wendall Mola, NP  Family History Family History  Problem Relation Age of Onset   Cancer Maternal Grandmother     Social History Social History   Tobacco Use   Smoking status: Never   Smokeless tobacco: Never  Vaping Use   Vaping Use: Never used  Substance Use Topics   Alcohol use: Not Currently    Comment: socially   Drug use: No     Allergies   Kiwi extract   Review of Systems Review of Systems  Constitutional:  Positive for fever. Negative for activity change, appetite change, chills, diaphoresis, fatigue and unexpected weight change.  HENT: Negative.    Respiratory: Negative.    Cardiovascular: Negative.   Gastrointestinal:  Positive for abdominal pain, diarrhea and vomiting. Negative for abdominal distention, anal bleeding, blood in stool, constipation, nausea and  rectal pain.     Physical Exam Triage Vital Signs ED Triage Vitals  Enc Vitals Group     BP 07/23/22 1753 109/77     Pulse Rate 07/23/22 1753 76     Resp 07/23/22 1753 16     Temp 07/23/22 1753 99 F (37.2 C)     Temp Source 07/23/22 1753 Oral     SpO2 07/23/22 1753 95 %     Weight 07/23/22 1753 230 lb (104.3 kg)     Height 07/23/22 1753 5\' 4"  (1.626 m)     Head Circumference --      Peak Flow --      Pain Score 07/23/22 1752 6     Pain Loc --      Pain Edu? --      Excl. in GC? --    No data found.  Updated Vital Signs BP 109/77 (BP Location: Right Arm)   Pulse 76   Temp 99 F (37.2 C) (Oral)   Resp 16   Ht 5\' 4"  (1.626 m)   Wt 230 lb (104.3 kg)   LMP  (LMP Unknown)   SpO2 95%   BMI 39.48 kg/m   Visual Acuity Right Eye Distance:   Left Eye Distance:   Bilateral Distance:    Right Eye Near:   Left Eye Near:    Bilateral Near:     Physical Exam Constitutional:      Appearance: Normal appearance.  Pulmonary:     Effort: Pulmonary effort is normal.  Abdominal:     General: Abdomen is flat. Bowel sounds are normal.     Palpations: Abdomen is soft.     Tenderness: There is abdominal tenderness in the epigastric area.  Neurological:     Mental Status: She is alert and oriented to person, place, and time. Mental status is at baseline.  Psychiatric:        Mood and Affect: Mood normal.        Behavior: Behavior normal.      UC Treatments / Results  Labs (all labs ordered are listed, but only abnormal results are displayed) Labs Reviewed - No data to display  EKG   Radiology No results found.  Procedures Procedures (including critical care time)  Medications Ordered in UC Medications - No data to display  Initial Impression / Assessment and Plan / UC Course  I have reviewed the triage vital signs and the nursing notes.  Pertinent labs & imaging results that were available during my care of the patient were reviewed by me and considered in  my medical decision making (see chart for details).  Gastroenteritis  Vital signs are stable patient is  in no signs of distress or toxic appearing, tenderness is noted to the epigastric region, no illness low suspicion for an acute abdomen symptoms are not present for 21 days will provide bacterial coverage, azithromycin prescribed as well as Zofran and Imodium, advised increase fluid intake until symptoms resolve resolved and fluid as tolerated, may follow-up if symptoms continue to persist Final Clinical Impressions(s) / UC Diagnoses   Final diagnoses:  None     Discharge Instructions      As her symptoms have been present for greater than 1 week we will provide coverage for bacteria that may be prolonging your symptoms  Take azithromycin every morning for the next 3 days  You may take Zofran every 8 hours to help calm your vomiting, placed under tongue and allowed to dissolve and wait 30 minutes to an hour before attempting to eat or drink  You may use Imodium every 6 hours as needed to help slow diarrhea, overuse will cause constipation  Increase your fluid intake until all your symptoms resolve, may eat food as tolerated  If your symptoms continue to persist or worsen please follow-up for reevaluation   ED Prescriptions   None    PDMP not reviewed this encounter.   Hans Eden, NP 07/24/22 [phone]

## 2022-07-23 NOTE — Discharge Instructions (Addendum)
As her symptoms have been present for greater than 1 week we will provide coverage for bacteria that may be prolonging your symptoms  Take azithromycin every morning for the next 3 days  You may take Zofran every 8 hours to help calm your vomiting, placed under tongue and allowed to dissolve and wait 30 minutes to an hour before attempting to eat or drink  You may use Imodium every 6 hours as needed to help slow diarrhea, overuse will cause constipation  Increase your fluid intake until all your symptoms resolve, may eat food as tolerated  If your symptoms continue to persist or worsen please follow-up for reevaluation

## 2022-07-23 NOTE — ED Triage Notes (Signed)
Chief Complaint: vomiting and diarrhea. After getting in 2 bites of food Patient starts to vomit. Diarrhea is fully liquid and yellow. Patient starting to get dizzy.   Onset:07/13/22  Prescriptions or OTC medications tried: Yes- ginger chews, dramamine     with mild relief  Sick exposure: Yes- her son was sick for 4 days but better now. Had diarrhea and emesis.   New foods, medications, or products: No  Recent Travel: No

## 2023-06-11 ENCOUNTER — Ambulatory Visit (HOSPITAL_COMMUNITY)
Admission: EM | Admit: 2023-06-11 | Discharge: 2023-06-11 | Disposition: A | Discharge status: Home / Self Care | Payer: 59

## 2023-06-11 ENCOUNTER — Encounter (HOSPITAL_COMMUNITY): Payer: Self-pay

## 2023-06-11 DIAGNOSIS — Z77098 Contact with and (suspected) exposure to other hazardous, chiefly nonmedicinal, chemicals: Secondary | ICD-10-CM

## 2023-06-11 MED ORDER — ALBUTEROL SULFATE HFA 108 (90 BASE) MCG/ACT IN AERS: 1.0000 | INHALATION_SPRAY | Freq: Four times a day (QID) | RESPIRATORY_TRACT | 0 refills | Status: AC | PRN
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
## 2023-06-11 NOTE — Discharge Instructions (Addendum)
Your physical exam was reassuring.  You have throat irritation from your chemical inhalation.  You can do warm saline gargles, sleep with a humidifier and drink tea with honey to help soothe this.  If you develop any wheezing or shortness of breath you can use the albuterol inhaler.  Ensure the area is cleaned from any excess bleach or ammonia and well ventilated.  Avoid exposures with these chemicals in the future.  If you ever get either of these chemicals into your eye please keep your eye open and irrigate with tap water for 10 to 15 minutes and then seek emergency care.
